# Patient Record
Sex: Female | Born: 1935 | Race: White | Hispanic: No | Smoking: Never smoker
Health system: Southern US, Community
[De-identification: ages and names within clinical notes are randomized; demographics above are authoritative.]

## PROBLEM LIST (undated history)

## (undated) DIAGNOSIS — F419 Anxiety disorder, unspecified: Secondary | ICD-10-CM

## (undated) DIAGNOSIS — E7800 Pure hypercholesterolemia, unspecified: Secondary | ICD-10-CM

## (undated) DIAGNOSIS — M810 Age-related osteoporosis without current pathological fracture: Secondary | ICD-10-CM

## (undated) DIAGNOSIS — R42 Dizziness and giddiness: Secondary | ICD-10-CM

## (undated) HISTORY — PX: KNEE ARTHROSCOPY: SUR90

## (undated) HISTORY — PX: RECTAL SURGERY: SHX760

---

## 2004-08-27 ENCOUNTER — Ambulatory Visit: Payer: Self-pay | Admitting: Internal Medicine

## 2005-05-21 ENCOUNTER — Ambulatory Visit: Payer: Self-pay | Admitting: Surgery

## 2005-06-01 ENCOUNTER — Ambulatory Visit: Payer: Self-pay | Admitting: Surgery

## 2005-06-01 ENCOUNTER — Other Ambulatory Visit: Payer: Self-pay

## 2005-06-10 ENCOUNTER — Inpatient Hospital Stay: Payer: Self-pay | Admitting: Surgery

## 2005-08-31 ENCOUNTER — Ambulatory Visit: Payer: Self-pay | Admitting: Internal Medicine

## 2006-09-02 ENCOUNTER — Ambulatory Visit: Payer: Self-pay | Admitting: Internal Medicine

## 2007-07-06 ENCOUNTER — Ambulatory Visit: Payer: Self-pay | Admitting: Surgery

## 2007-07-07 ENCOUNTER — Ambulatory Visit: Payer: Self-pay | Admitting: Surgery

## 2007-09-05 ENCOUNTER — Ambulatory Visit: Payer: Self-pay | Admitting: Internal Medicine

## 2007-09-19 ENCOUNTER — Inpatient Hospital Stay: Payer: Self-pay | Admitting: Internal Medicine

## 2008-08-12 IMAGING — RF DG BARIUM SWALLOW
1 series · 8 of 8 positions shown · non-contrast
Comparison: none

REASON FOR EXAM: dysphagia
COMMENTS:

PROCEDURE:     FL  - FL BARIUM SWALLOW  - September 26, 2007  [DATE]
RESULT:     A hiatal hernia is noted with reflux.  Diffuse tertiary
esophageal contractions are present.  This is most likely secondary to
presbyesophagus.  The patient had no difficulty with swallowing a standard
barium tablet.

[Series 1: run · 5 acquisitions, 8 frames shown]
[im 1/5]
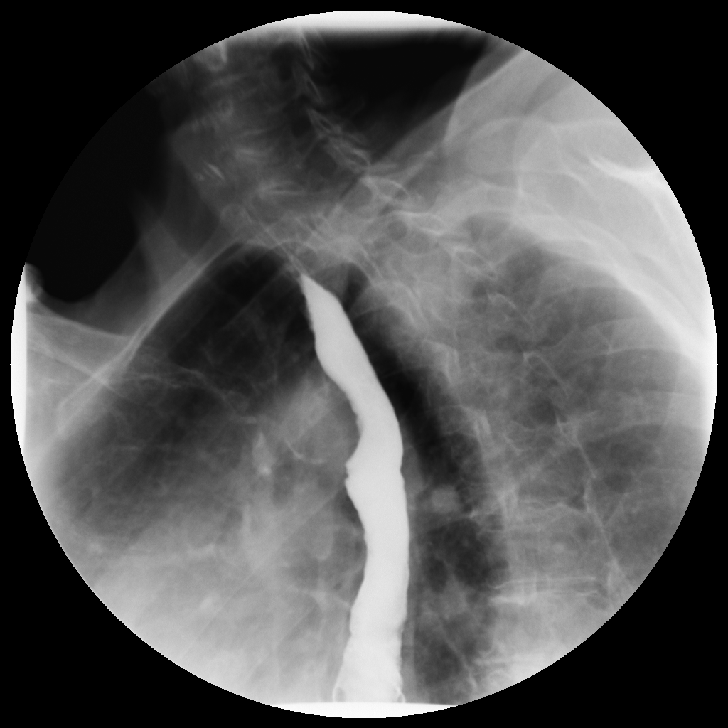
[im 1/5]
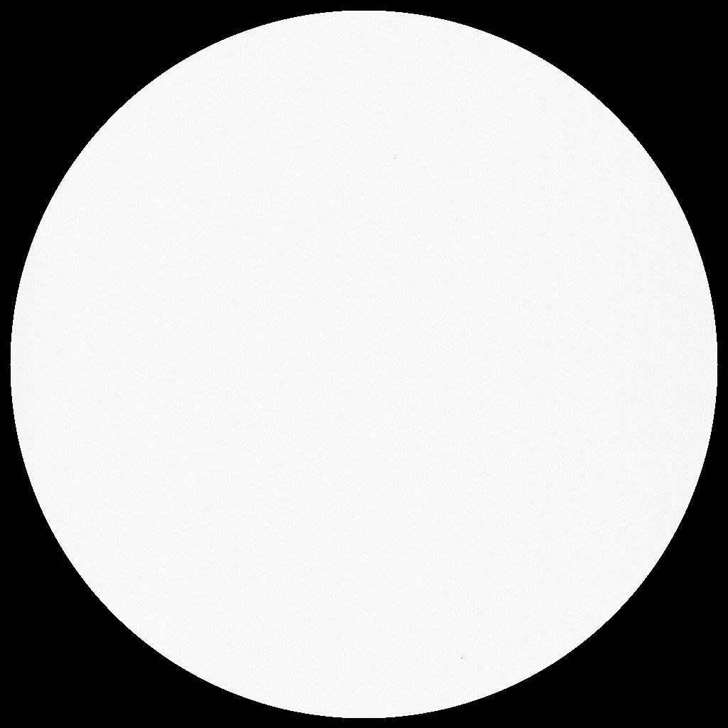
[im 2/5]
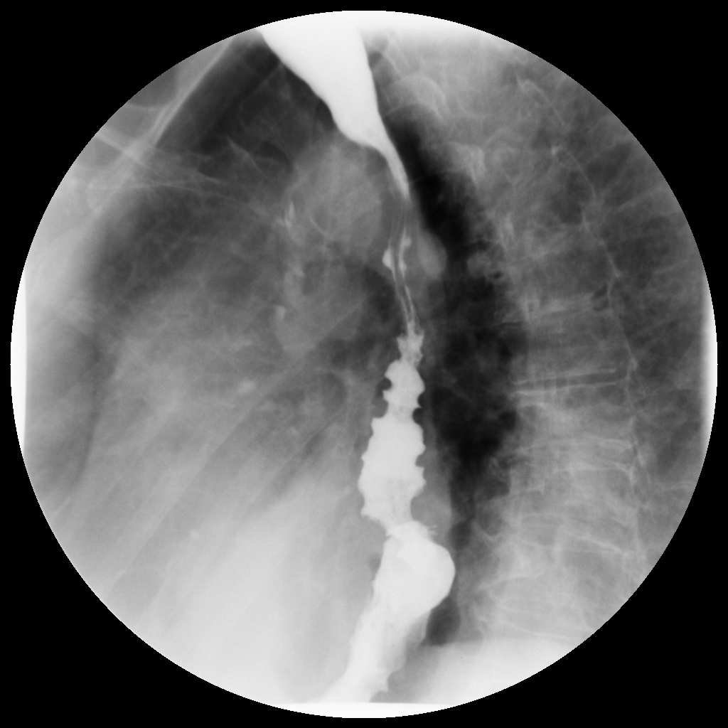
[im 2/5]
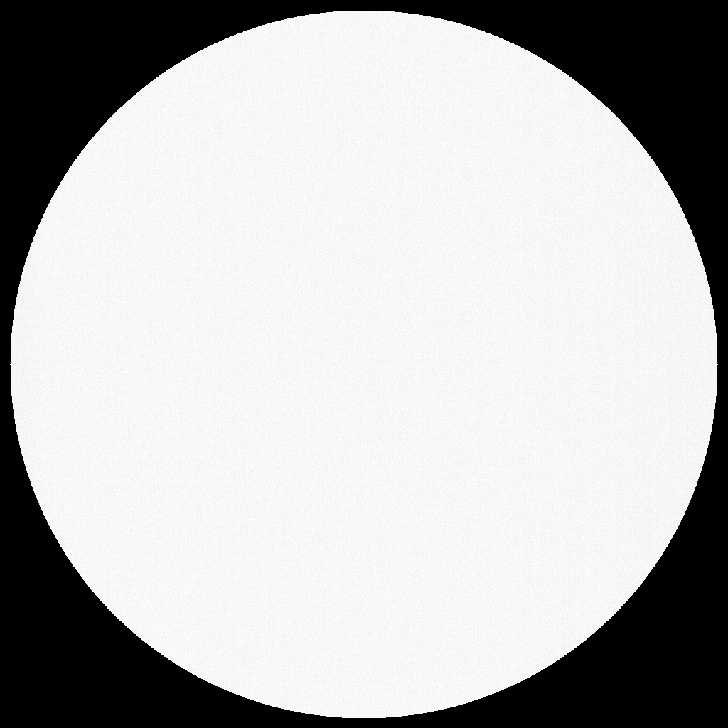
[im 3/5]
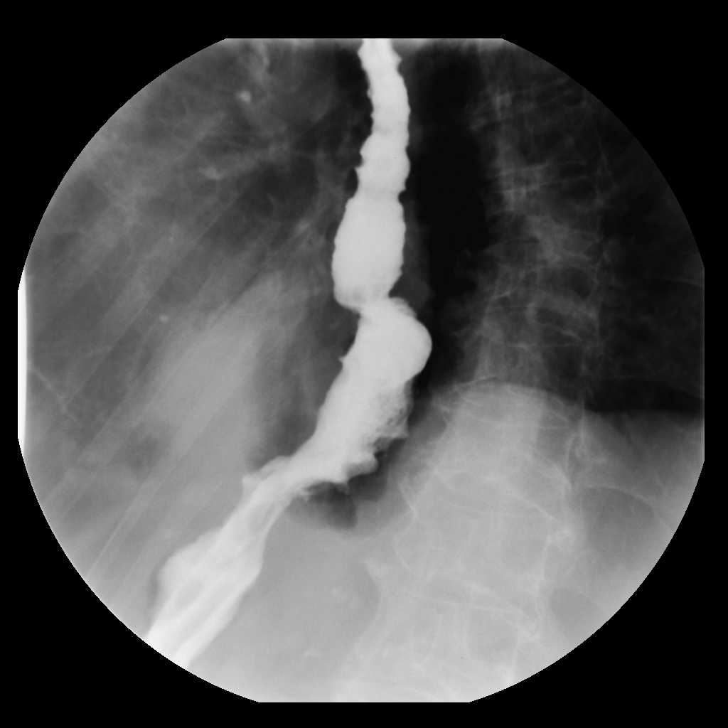
[im 3/5]
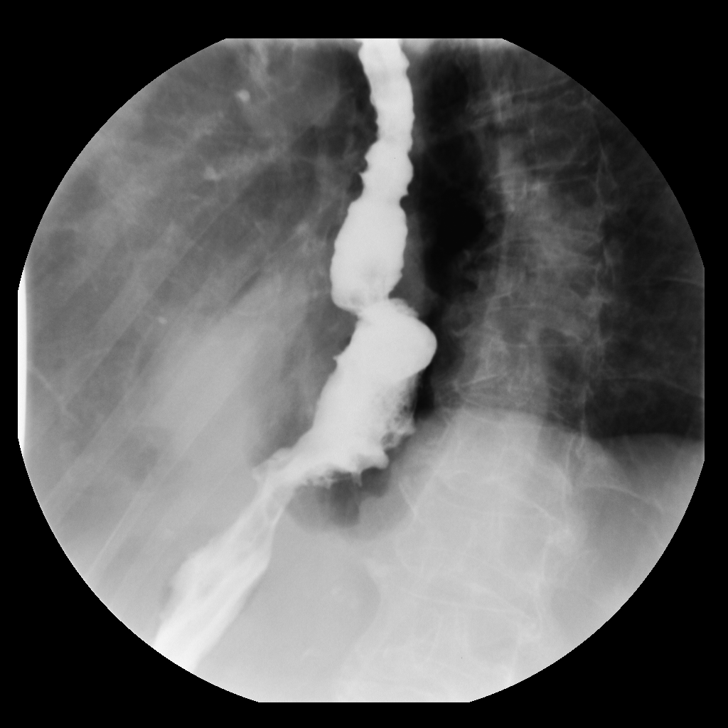
[im 4/5]
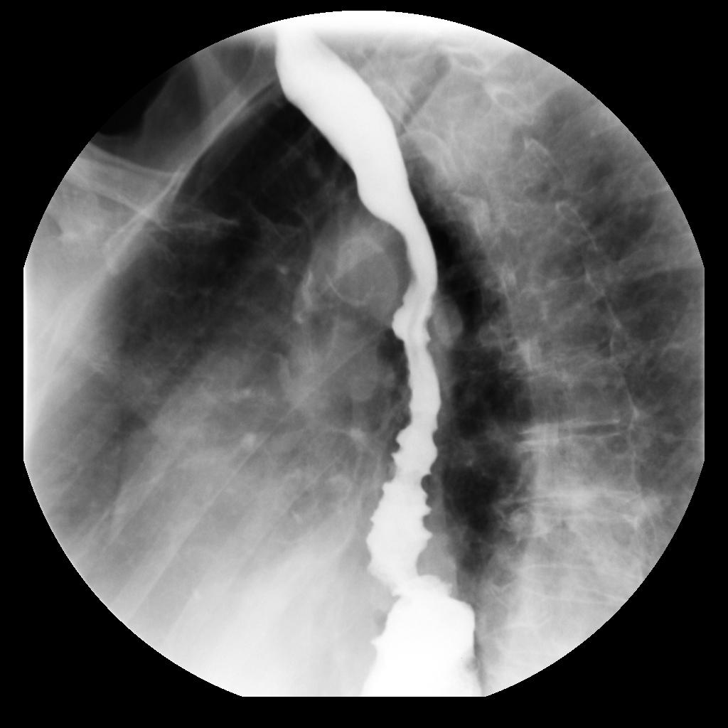
[im 5/5]
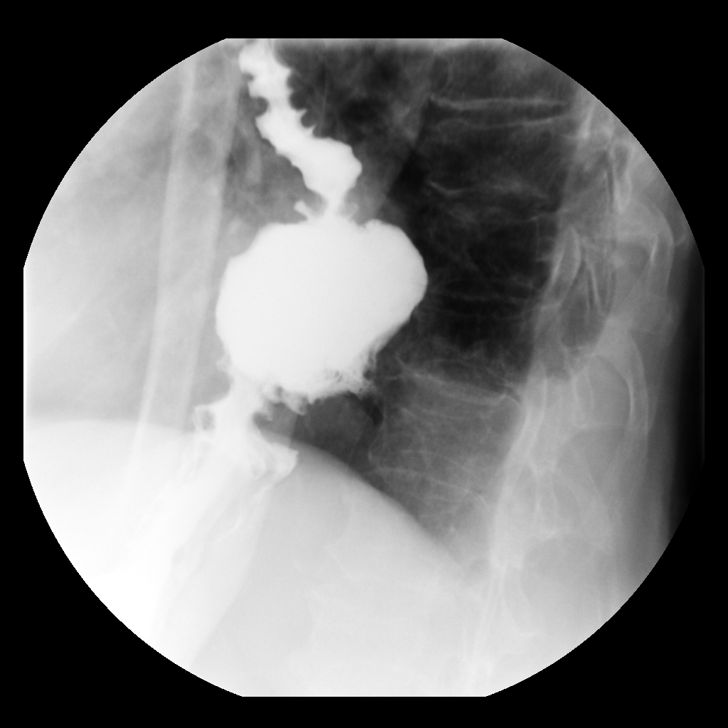

[8 of 8 positions shown; findings below may reference images not displayed]

IMPRESSION: Prominent hiatal hernia with gastroesophageal reflux.  This is a slightly
limited study due to the patient's age.  The patient had no problem
swallowing a standard barium tablet.

## 2009-10-25 ENCOUNTER — Ambulatory Visit: Payer: Self-pay | Admitting: Orthopedic Surgery

## 2009-11-01 ENCOUNTER — Ambulatory Visit: Payer: Self-pay | Admitting: Orthopedic Surgery

## 2009-11-25 ENCOUNTER — Ambulatory Visit: Payer: Self-pay | Admitting: Orthopedic Surgery

## 2009-11-29 ENCOUNTER — Ambulatory Visit: Payer: Self-pay | Admitting: Orthopedic Surgery

## 2011-03-11 ENCOUNTER — Ambulatory Visit: Payer: Self-pay | Admitting: Orthopedic Surgery

## 2011-03-17 ENCOUNTER — Ambulatory Visit: Payer: Self-pay | Admitting: Orthopedic Surgery

## 2012-08-17 ENCOUNTER — Ambulatory Visit: Payer: Self-pay | Admitting: Surgery

## 2012-08-18 LAB — PATHOLOGY REPORT

## 2014-11-23 NOTE — Op Note (Signed)
PATIENT NAME:  Margaret Stephens, Margaret Stephens MR#:  [id number] DATE OF BIRTH:  [birth year]  DATE OF PROCEDURE:  08/17/2012  PREOPERATIVE DIAGNOSIS: Past history of villous adenoma of the rectum.   POSTOPERATIVE DIAGNOSES:  1. Rectal polyps. 2. Diverticulosis.  3. Internal and external hemorrhoids.   PROCEDURES: Colonoscopy, biopsy and cautery.   SURGEON: Renda RollsWilton Solene Hereford, MD   ANESTHESIA: Monitored anesthesia care with intravenous sedation.   INDICATION: This 81 year old female has a history of a transanal resection of a villous adenoma and subsequent transanal resection of a recurrent villous adenoma, has been monitored for a number of years and is now brought back for a follow-up colonoscopy.  DESCRIPTION OF PROCEDURE:  The patient was placed on the stretcher in the left lateral decubitus position and was monitored with pulse oximetry, intermittent blood pressure recordings, and cardiac monitor, given oxygen via nasal cannula at a rate of 2 L/min, monitored by the anesthesia staff and sedated.   The Olympus Video Colonoscope was inserted through the rectum and passed up into the colon and was manipulated around. It appeared that the scope actually advanced in as far as a meter, but there was some tortuosity of the colon. The scope was well into the transverse colon possibly as far as the ascending colon; however, there was significant tortuosity and it did not appear that the scope could be safely passed any farther. Colon preparation appeared to be good. A small amount of bilious feculent material was aspirated. The scope was gradually pulled back, viewing the colonic mucosa, seeing no polyps or tumors in this portion of the bowel. There were a few sigmoid diverticula noted. There were two small polyps in the rectum which just appeared to be few millimeters in dimension. Each of these were removed with hot biopsy and completely obliterated and cauterized at the base and submitted in one container for routine  pathology. It appeared that they may possibly be hyperplastic. The scope was retroflexed, and I could identify the site where she had had some scarring and history of excision of villous adenoma. There were some small internal hemorrhoids appreciated. The scope was subsequently removed. Digital anorectal exam demonstrated no palpable mass. She did have some external hemorrhoids.   The patient was subsequently prepared for transfer to the recovery room.   ____________________________ Shela CommonsJ. Renda RollsWilton Wallice Granville, MD jws:cb D: 08/17/2012 09:15:15 ET T: 08/17/2012 10:54:03 ET JOB#: [id number]  cc: Adella HareJ. Wilton Tongela Encinas, MD, <Dictator> Adella HareWILTON J Brentney Goldbach MD ELECTRONICALLY SIGNED 08/19/2012 16:56

## 2016-05-14 ENCOUNTER — Emergency Department: Payer: Medicare Other

## 2016-05-14 ENCOUNTER — Encounter: Payer: Self-pay | Admitting: Emergency Medicine

## 2016-05-14 ENCOUNTER — Emergency Department
Admission: EM | Admit: 2016-05-14 | Discharge: 2016-05-14 | Disposition: A | Discharge status: Home / Self Care | Payer: Medicare Other | Attending: Emergency Medicine | Admitting: Emergency Medicine

## 2016-05-14 DIAGNOSIS — R509 Fever, unspecified: Secondary | ICD-10-CM | POA: Diagnosis present

## 2016-05-14 DIAGNOSIS — J069 Acute upper respiratory infection, unspecified: Secondary | ICD-10-CM | POA: Insufficient documentation

## 2016-05-14 DIAGNOSIS — J111 Influenza due to unidentified influenza virus with other respiratory manifestations: Secondary | ICD-10-CM

## 2016-05-14 DIAGNOSIS — R69 Illness, unspecified: Secondary | ICD-10-CM

## 2016-05-14 LAB — COMPREHENSIVE METABOLIC PANEL
ALK PHOS: 64 U/L (ref 38–126)
ALT: 15 U/L (ref 14–54)
AST: 25 U/L (ref 15–41)
Albumin: 4.2 g/dL (ref 3.5–5.0)
Anion gap: 9 (ref 5–15)
BUN: 9 mg/dL (ref 6–20)
CALCIUM: 9.4 mg/dL (ref 8.9–10.3)
CHLORIDE: 99 mmol/L — AB (ref 101–111)
CO2: 28 mmol/L (ref 22–32)
CREATININE: 0.66 mg/dL (ref 0.44–1.00)
Glucose, Bld: 106 mg/dL — ABNORMAL HIGH (ref 65–99)
Potassium: 4.1 mmol/L (ref 3.5–5.1)
Sodium: 136 mmol/L (ref 135–145)
Total Bilirubin: 0.8 mg/dL (ref 0.3–1.2)
Total Protein: 8.1 g/dL (ref 6.5–8.1)

## 2016-05-14 LAB — CBC WITH DIFFERENTIAL/PLATELET
BASOS ABS: 0 10*3/uL (ref 0–0.1)
BASOS PCT: 0 %
EOS PCT: 0 %
Eosinophils Absolute: 0 10*3/uL (ref 0–0.7)
HCT: 41 % (ref 35.0–47.0)
Hemoglobin: 14.1 g/dL (ref 12.0–16.0)
Lymphocytes Relative: 12 %
Lymphs Abs: 0.9 10*3/uL — ABNORMAL LOW (ref 1.0–3.6)
MCH: 30.8 pg (ref 26.0–34.0)
MCHC: 34.4 g/dL (ref 32.0–36.0)
MCV: 89.7 fL (ref 80.0–100.0)
Monocytes Absolute: 0.7 10*3/uL (ref 0.2–0.9)
Monocytes Relative: 10 %
NEUTROS ABS: 5.9 10*3/uL (ref 1.4–6.5)
Neutrophils Relative %: 78 %
PLATELETS: 175 10*3/uL (ref 150–440)
RBC: 4.57 MIL/uL (ref 3.80–5.20)
RDW: 13.8 % (ref 11.5–14.5)
WBC: 7.5 10*3/uL (ref 3.6–11.0)

## 2016-05-14 LAB — URINALYSIS COMPLETE WITH MICROSCOPIC (ARMC ONLY)
Bilirubin Urine: NEGATIVE
GLUCOSE, UA: NEGATIVE mg/dL
Hgb urine dipstick: NEGATIVE
Ketones, ur: NEGATIVE mg/dL
Nitrite: NEGATIVE
Protein, ur: NEGATIVE mg/dL
SQUAMOUS EPITHELIAL / LPF: NONE SEEN
Specific Gravity, Urine: 1.006 (ref 1.005–1.030)
pH: 7 (ref 5.0–8.0)

## 2016-05-14 LAB — LACTIC ACID, PLASMA: LACTIC ACID, VENOUS: 1 mmol/L (ref 0.5–1.9)

## 2016-05-14 LAB — INFLUENZA PANEL BY PCR (TYPE A & B)
H1N1 flu by pcr: NOT DETECTED
INFLAPCR: NEGATIVE
Influenza B By PCR: NEGATIVE

## 2016-05-14 MED ORDER — ACETAMINOPHEN 325 MG PO TABS
ORAL_TABLET | ORAL | Status: AC
  Filled 2016-05-14: qty 2

## 2016-05-14 MED ORDER — LEVOFLOXACIN 500 MG PO TABS: 500.0000 mg | ORAL_TABLET | Freq: Every day | ORAL | 0 refills | Status: AC

## 2016-05-14 MED ORDER — ACETAMINOPHEN 325 MG PO TABS
650.0000 mg | ORAL_TABLET | Freq: Once | ORAL | Status: AC
  Administered 2016-05-14: 650 mg via ORAL

## 2016-05-14 MED ORDER — SODIUM CHLORIDE 0.9 % IV BOLUS (SEPSIS)
1000.0000 mL | Freq: Once | INTRAVENOUS | Status: AC
  Administered 2016-05-14: 1000 mL via INTRAVENOUS

## 2016-05-14 MED ORDER — LEVOFLOXACIN 500 MG PO TABS
500.0000 mg | ORAL_TABLET | Freq: Once | ORAL | Status: AC
  Administered 2016-05-14: 500 mg via ORAL
  Filled 2016-05-14: qty 1

## 2016-05-14 NOTE — ED Provider Notes (Addendum)
[facility] Emergency Department Provider Note  ____________________________________________   I have reviewed the triage vital signs and the nursing notes.   HISTORY  Chief Complaint Fever and Sore Throat    HPI Margaret Stephens is a 82 y.o. female who presents today with cough runny nose sore throat and fever for the last 2 days. He is not had any vomiting or nausea or diarrhea or abdominal pain she denies dysuria. She has been drinking and eating less. Her sore throat is improving. She has had a occasionally productive of mostly dry cough. She states she feels "pretty good". She was sent here for possible dehydration and evaluation with chest x-ray. Fever started today.     History reviewed. No pertinent past medical history.  There are no active problems to display for this patient.   History reviewed. No pertinent surgical history.  Prior to Admission medications   Not on File    Allergies Review of patient's allergies indicates no known allergies.  No family history on file.  Social History Social History  Substance Use Topics  . Smoking status: Never Smoker  . Smokeless tobacco: Never Used  . Alcohol use No    Review of Systems Constitutional: Positive fever/chills Eyes: No visual changes. ENT: Positive sore throat. No stiff neck no neck pain Cardiovascular: Denies chest pain. Respiratory: Denies shortness of breath. All that of cough Gastrointestinal:   no vomiting.  No diarrhea.  No constipation. Genitourinary: Negative for dysuria. Musculoskeletal: Negative lower extremity swelling Skin: Negative for rash. Neurological: Negative for severe headaches, focal weakness or numbness. 10-point ROS otherwise negative.  ____________________________________________   PHYSICAL EXAM:  VITAL SIGNS: ED Triage Vitals  Enc Vitals Group     BP 05/14/16 1537 (!) 161/80     Pulse Rate 05/14/16 1537 (!) 106     Resp 05/14/16 1537 18   Temp 05/14/16 1537 (!) 101.3 F (38.5 C)     Temp Source 05/14/16 1537 Oral     SpO2 05/14/16 1537 93 %     Weight 05/14/16 1535 122 lb (55.3 kg)     Height 05/14/16 1535 4\' 11"  (1.499 m)     Head Circumference --      Peak Flow --      Pain Score 05/14/16 1535 3     Pain Loc --      Pain Edu? --      Excl. in GC? --     Constitutional: Alert and oriented. Well appearing and in no acute distress. Eyes: Conjunctivae are normal. PERRL. EOMI. Head: Atraumatic. Nose: Positive mild clear congestion/rhinnorhea. Mouth/Throat: Mucous membranes are moist.  Oropharynx non-erythematous.Mild cobblestoning noted Neck: No stridor.   Nontender with no meningismus Cardiovascular: Normal rate, regular rhythm. Grossly normal heart sounds.  Good peripheral circulation. Respiratory: Normal respiratory effort.  No retractions. Lungs CTAB. Abdominal: Soft and nontender. No distention. No guarding no rebound Back:  There is no focal tenderness or step off.  there is no midline tenderness there are no lesions noted. there is no CVA tenderness Musculoskeletal: No lower extremity tenderness, no upper extremity tenderness. No joint effusions, no DVT signs strong distal pulses no edema Neurologic:  Normal speech and language. No gross focal neurologic deficits are appreciated.  Skin:  Skin is warm, dry and intact. No rash noted. Psychiatric: Mood and affect are normal. Speech and behavior are normal.  ____________________________________________   LABS (all labs ordered are listed, but only abnormal results are displayed)  Labs Reviewed  CBC WITH DIFFERENTIAL/PLATELET - Abnormal; Notable for the following:       Result Value   Lymphs Abs 0.9 (*)    All other components within normal limits  COMPREHENSIVE METABOLIC PANEL - Abnormal; Notable for the following:    Chloride 99 (*)    Glucose, Bld 106 (*)    All other components within normal limits  CULTURE, BLOOD (ROUTINE X 2)  CULTURE, BLOOD (ROUTINE X  2)  URINE CULTURE  LACTIC ACID, PLASMA  INFLUENZA PANEL BY PCR (TYPE A & B, H1N1)  LACTIC ACID, PLASMA  URINALYSIS COMPLETEWITH MICROSCOPIC ([facility] ONLY)   ____________________________________________  EKG  I personally interpreted any EKGs ordered by me or triage  ____________________________________________  RADIOLOGY  I reviewed any imaging ordered by me or triage that were performed during my shift and, if possible, patient and/or family made aware of any abnormal findings. ____________________________________________   PROCEDURES  Procedure(s) performed: None  Procedures  Critical Care performed: None  ____________________________________________   INITIAL IMPRESSION / ASSESSMENT AND PLAN / ED COURSE  Pertinent labs & imaging results that were available during my care of the patient were reviewed by me and considered in my medical decision making (see chart for details).  Patient with URI symptoms and a fever. Concern for pneumonia exists but the patient's chest x-ray is reassuring. Occasionally we have poor waveform and corresponding poor sats but when she takes deep breaths and we are getting a good reading she is in the mid to high 90s. We're giving her IV fluid. Her creatinine and BUN are reassuring however and I am not seeing much in the way of significant dehydration. Patient flu test is pending. We will also check a urine because of her fever. Her lactic is negative her white count is reassuring. There is no evidence of sepsis. Vital signs are reassuring. We will further evaluate her with a flu test. Patient feeling better with IV fluid. Abdomen is benign. No obvious source of bacterial infection noted. Certainly no evidence of strep throat or TOA RPA etc.  ----------------------------------------- 8:21 PM on 05/14/2016 -----------------------------------------  She continues very well-appearing, did discuss with the family the possibility this is likely a viral  illness. They're adamant however that she should have antibiotics. They feel that Levaquin is what she usually gets when she has this and it usually makes her better. Patient has had significant pneumonia problems in the past. Caution saturations 100% in the room. I did explain to them the risk of antibiotic and clearly significant diarrhea or other pathologies and they are understanding. However, they would very much feel that the patient would do better with Levaquin. Given that the patient has a history of pneumonia, is 82 years old has a fever and a cough we will treat her empirically as it is certainly possible that she has a pneumonia that is not yet appearing on chest x-ray. Return precautions and follow-up given and understood. We also did discuss the risks of fluoroquinolones and patient's her age but again family feels adamant that this is helped in the past and patient as needed and indicated has certainly some significant risk factors of this deteriorating into a more significant pathology Clinical Course   ____________________________________________   FINAL CLINICAL IMPRESSION(S) / ED DIAGNOSES  Final diagnoses:  None      This chart was dictated using voice recognition software.  Despite best efforts to proofread,  errors can occur which can change meaning.      Jeanmarie PlantJames A Marshell Dilauro,  MD 05/14/16 1936    Jeanmarie Plant, MD 05/14/16 1610    Jeanmarie Plant, MD 05/14/16 2022

## 2016-05-14 NOTE — ED Notes (Signed)
Pt c/o sore throat and fever x2-3 days. Pt denies n/v/d. Pt states decreased appetite and PO intake. Pt A&O, in NAD at this time

## 2016-05-14 NOTE — ED Triage Notes (Addendum)
C/O fever and sore throat and cough x 2 days.  Seen by Dr. Dan HumphreysWalker today and sent to ED for evaluation due to being dehydrated and possible pneumonia.

## 2016-05-16 LAB — URINE CULTURE

## 2016-05-19 LAB — CULTURE, BLOOD (ROUTINE X 2): CULTURE: NO GROWTH

## 2016-12-19 ENCOUNTER — Encounter: Payer: Self-pay | Admitting: Emergency Medicine

## 2016-12-19 ENCOUNTER — Encounter: Payer: Self-pay | Admitting: *Deleted

## 2016-12-19 ENCOUNTER — Emergency Department: Payer: Medicare Other

## 2016-12-19 ENCOUNTER — Ambulatory Visit
Admission: EM | Admit: 2016-12-19 | Discharge: 2016-12-19 | Disposition: A | Discharge status: Hospice | Payer: Medicare Other | Source: Home / Self Care

## 2016-12-19 ENCOUNTER — Emergency Department
Admission: EM | Admit: 2016-12-19 | Discharge: 2016-12-19 | Disposition: A | Discharge status: Home / Self Care | Payer: Medicare Other | Attending: Emergency Medicine | Admitting: Emergency Medicine

## 2016-12-19 DIAGNOSIS — Z23 Encounter for immunization: Secondary | ICD-10-CM | POA: Diagnosis not present

## 2016-12-19 DIAGNOSIS — Z79899 Other long term (current) drug therapy: Secondary | ICD-10-CM | POA: Insufficient documentation

## 2016-12-19 DIAGNOSIS — L03011 Cellulitis of right finger: Secondary | ICD-10-CM | POA: Insufficient documentation

## 2016-12-19 DIAGNOSIS — M79644 Pain in right finger(s): Secondary | ICD-10-CM | POA: Diagnosis present

## 2016-12-19 MED ORDER — BUPIVACAINE HCL (PF) 0.5 % IJ SOLN
10.0000 mL | Freq: Once | INTRAMUSCULAR | Status: DC
  Filled 2016-12-19: qty 30

## 2016-12-19 MED ORDER — LIDOCAINE HCL (PF) 1 % IJ SOLN
5.0000 mL | Freq: Once | INTRAMUSCULAR | Status: DC
  Filled 2016-12-19: qty 5

## 2016-12-19 MED ORDER — CEFAZOLIN SODIUM 1 G IJ SOLR
1.0000 g | Freq: Once | INTRAMUSCULAR | Status: AC
  Administered 2016-12-19: 1 g via INTRAMUSCULAR
  Filled 2016-12-19: qty 10

## 2016-12-19 MED ORDER — TRAMADOL HCL 50 MG PO TABS: 50.0000 mg | ORAL_TABLET | Freq: Four times a day (QID) | ORAL | 0 refills | Status: DC | PRN

## 2016-12-19 MED ORDER — TETANUS-DIPHTH-ACELL PERTUSSIS 5-2.5-18.5 LF-MCG/0.5 IM SUSP
0.5000 mL | Freq: Once | INTRAMUSCULAR | Status: AC
  Administered 2016-12-19: 0.5 mL via INTRAMUSCULAR
  Filled 2016-12-19: qty 0.5

## 2016-12-19 MED ORDER — CEPHALEXIN 500 MG PO CAPS: 500.0000 mg | ORAL_CAPSULE | Freq: Four times a day (QID) | ORAL | 0 refills | Status: AC

## 2016-12-19 NOTE — ED Triage Notes (Signed)
Pt sent from urgent care for felon of right thumb.  Swelling/redness noted. Sx started Thursday. Pt ambulatory to triage. No medical hx. NAD

## 2016-12-19 NOTE — ED Provider Notes (Signed)
CSN: [id number]     Arrival date & time 12/19/16  0907 History   First MD Initiated Contact with Patient 12/19/16 0919     Chief Complaint  Patient presents with  . Hand Pain   (Consider location/radiation/quality/duration/timing/severity/associated sxs/prior Treatment) HPI This 83 year old female who presents with a right dominant swollen thumb tip. Is it is been bothering her for the last 2 days and is worsening. Earlier in the week she had cut and trimmed her nails does not remember any specific injury to nail but shortly afterwards developed of the swelling of the thumb. Now any motion makes the pain worse. She has no fever or chills.      History reviewed. No pertinent past medical history. Past Surgical History:  Procedure Laterality Date  . KNEE ARTHROSCOPY Left   . RECTAL SURGERY     History reviewed. No pertinent family history. Social History  Substance Use Topics  . Smoking status: Never Smoker  . Smokeless tobacco: Never Used  . Alcohol use No   OB History    No data available     Review of Systems  Constitutional: Positive for activity change. Negative for chills, fatigue and fever.  Musculoskeletal: Positive for arthralgias.  All other systems reviewed and are negative.   Allergies  Patient has no known allergies.  Home Medications   Prior to Admission medications   Medication Sig Start Date End Date Taking? Authorizing Provider  alendronate (FOSAMAX) 70 MG tablet Take 70 mg by mouth once a week. Take with a full glass of water on an empty stomach.   Yes [provider]  aspirin EC 81 MG tablet Take 81 mg by mouth daily.   Yes [provider]  LORazepam (ATIVAN) 0.5 MG tablet Take 0.5 mg by mouth every 8 (eight) hours.   Yes [provider]  simvastatin (ZOCOR) 40 MG tablet Take 40 mg by mouth daily.   Yes [provider]   Meds Ordered and Administered this Visit  Medications - No data to display  BP (!) 163/75  (BP Location: Left Arm)   Pulse 83   Temp 98.4 F (36.9 C) (Oral)   Resp 16   Ht 4\' 11"  (1.499 m)   Wt 119 lb (54 kg)   SpO2 96%   BMI 24.04 kg/m  No data found.   Physical Exam  Constitutional: She is oriented to person, place, and time. She appears well-developed and well-nourished. No distress.  HENT:  Head: Normocephalic and atraumatic.  Eyes: Pupils are equal, round, and reactive to light.  Neck: Normal range of motion.  Musculoskeletal: She exhibits edema and tenderness.  Examination of the right dominant thumb shows enlargement I erythema and fluctuance of the distal thumb pad. Is no drainage present. There is no evidence of a paronychia. Next pain and redness and swelling are extending into the first web space.  Neurological: She is alert and oriented to person, place, and time.  Skin: Skin is warm and dry. She is not diaphoretic.  Psychiatric: She has a normal mood and affect. Her behavior is normal. Judgment and thought content normal.  Nursing note and vitals reviewed.   Urgent Care Course     Procedures (including critical care time)  Labs Review Labs Reviewed - No data to display  Imaging Review No results found.   Visual Acuity Review  Right Eye Distance:   Left Eye Distance:   Bilateral Distance:    Right Eye Near:   Left Eye Near:  Bilateral Near:         MDM   1. Felon of finger of right hand    I have had a discussion with the patient and her family member accompanying her. I told them that this will need surgical drainage that we do not perform in our office. I recommended emergency room. [facility] I contacted the triage nurse to alert her of the patient's arrival and condition. She left in stable condition and will be transported in a privately owned vehicle.    Lutricia FeilRoemer, Walida Cajas P, PA-C 12/19/16 [phone]

## 2016-12-19 NOTE — Discharge Instructions (Signed)
Call and schedule an appointment with Dr. Stephenie AcresSoria. She will be in her office on Tuesday. Call Monday to schedule an appointment.  Keep the bandage in place for 24 hours unless it gets saturated, wet, or dirty. Change the bandage after 24 hours.  If the pain or swelling gets worse before you can see the specialist, return to the ER.

## 2016-12-19 NOTE — ED Provider Notes (Signed)
[facility] Emergency Department Provider Note  ____________________________________________  Time seen: Approximately 11:14 AM  I have reviewed the triage vital signs and the nursing notes.   HISTORY  Chief Complaint felon right thumb   HPI Margaret Stephens is a 83 y.o. female who presents to the emergency department for treatment of infection to the right thumb. Symptoms started 2 days ago. She states that she had trimmed her nails earlier in the week, but denies any specific injury. No fever. Tetanus is not up-to-date.  History reviewed. No pertinent past medical history.  There are no active problems to display for this patient.   Past Surgical History:  Procedure Laterality Date  . KNEE ARTHROSCOPY Left   . RECTAL SURGERY      Prior to Admission medications   Medication Sig Start Date End Date Taking? Authorizing Provider  alendronate (FOSAMAX) 70 MG tablet Take 70 mg by mouth once a week. Take with a full glass of water on an empty stomach.    [provider]  aspirin EC 81 MG tablet Take 81 mg by mouth daily.    [provider]  cephALEXin (KEFLEX) 500 MG capsule Take 1 capsule (500 mg total) by mouth 4 (four) times daily. 12/19/16 12/29/16  Jaquell Seddon, Rulon Eisenmenger B, FNP  LORazepam (ATIVAN) 0.5 MG tablet Take 0.5 mg by mouth every 8 (eight) hours.    [provider]  simvastatin (ZOCOR) 40 MG tablet Take 40 mg by mouth daily.    [provider]  traMADol (ULTRAM) 50 MG tablet Take 1 tablet (50 mg total) by mouth every 6 (six) hours as needed. 12/19/16   Chinita Pester, FNP    Allergies Patient has no known allergies.  History reviewed. No pertinent family history.  Social History Social History  Substance Use Topics  . Smoking status: Never Smoker  . Smokeless tobacco: Never Used  . Alcohol use No    Review of Systems  Constitutional: Negative for fever. Negative for injury.  Respiratory: Negative for cough or  shortness of breath.  Musculoskeletal: Positive for decreased range of motion of the right thumb  Skin: Positive for tenderness and swelling to the right thumb Neurological: Negative for loss of motor or sensation in the right hand ____________________________________________   PHYSICAL EXAM:  VITAL SIGNS: ED Triage Vitals  Enc Vitals Group     BP 12/19/16 1007 (!) 181/100     Pulse Rate 12/19/16 1007 86     Resp 12/19/16 1007 18     Temp 12/19/16 1007 98 F (36.7 C)     Temp Source 12/19/16 1007 Oral     SpO2 12/19/16 1007 95 %     Weight 12/19/16 1006 119 lb (54 kg)     Height 12/19/16 1006 4\' 11"  (1.499 m)     Head Circumference --      Peak Flow --      Pain Score 12/19/16 1006 4     Pain Loc --      Pain Edu? --      Excl. in GC? --      Constitutional: Well appearing. No acute distress. Mouth/Throat: Mucous membranes are moist. No dysphasia. Neck: Full range of motion. No stridor. Lymphatic: No palpable lymphadenopathy over the right upper extremity  Cardiovascular: Right radial pulse 2+ Respiratory: Even and unlabored. Musculoskeletal: Limited flexion at the DIP of the right thumb secondary to pain and swelling Neurologic: Motor and sensation is intact of the right thumb Skin:  Pad of the right thumb is fluctuant and swollen  ____________________________________________   LABS (all labs ordered are listed, but only abnormal results are displayed)  Labs Reviewed - No data to display ____________________________________________  EKG   ____________________________________________  RADIOLOGY  No acute osseous abnormality of the right thumb per radiology. ____________________________________________   PROCEDURES  Procedure(s) performed:  INCISION AND DRAINAGE Performed by: Kem Boroughsari Cherl Gorney Consent: Verbal consent obtained. Risks and benefits: risks, benefits and alternatives were discussed Type: abscess  Body area: Pad of right thumb  Anesthesia:  local infiltration  Incision was made on the radial side of the thumb with a #11 blade.  Local anesthetic: lidocaine 1% without epinephrine; 0.5 bupivacaine  Anesthetic total: 3 ml  Complexity: complex  Blunt dissection to break up loculations  Drainage: purulent  Drainage amount: Small  Patient tolerance: Patient tolerated the procedure well with no immediate complications. ___________________________________________   INITIAL IMPRESSION / ASSESSMENT AND PLAN / ED COURSE  Margaret Stephens is a 83 y.o. female who presents to the emergency department for evaluation of thumb infection.Felon I&D completed today in the ER with result of small amount of purulent drainage. IM Ancef was given as well as tetanus update. She will be prescribed Keflex 4 times per day and tramadol. Family will contact the hand specialist on Monday to schedule a follow-up appointment for Tuesday. Wound care and ER return precautions were discussed with the patient and family.   Pertinent labs & imaging results that were available during my care of the patient were reviewed by me and considered in my medical decision making (see chart for details). ____________________________________________   FINAL CLINICAL IMPRESSION(S) / ED DIAGNOSES  Final diagnoses:  Felon of finger of right hand    New Prescriptions   CEPHALEXIN (KEFLEX) 500 MG CAPSULE    Take 1 capsule (500 mg total) by mouth 4 (four) times daily.   TRAMADOL (ULTRAM) 50 MG TABLET    Take 1 tablet (50 mg total) by mouth every 6 (six) hours as needed.    If controlled substance prescribed during this visit, 12 month history viewed on the [facility] prior to issuing an initial prescription for Schedule II or III opiod.   Note:  This document was prepared using Dragon voice recognition software and may include unintentional dictation errors.    Chinita Pesterriplett, [place] Sylvan B, FNP 12/19/16 1202    Arnaldo NatalMalinda, Paul F, MD 12/19/16 [phone]

## 2016-12-19 NOTE — ED Triage Notes (Signed)
Pt c/o right thumb pain and edema. Denies injury.

## 2017-05-21 ENCOUNTER — Encounter: Payer: Self-pay | Admitting: Emergency Medicine

## 2017-05-21 ENCOUNTER — Emergency Department: Payer: Medicare Other

## 2017-05-21 ENCOUNTER — Observation Stay
Admission: EM | Admit: 2017-05-21 | Discharge: 2017-05-23 | Disposition: A | Discharge status: Home / Self Care | Payer: Medicare Other | Attending: Internal Medicine | Admitting: Internal Medicine

## 2017-05-21 ENCOUNTER — Observation Stay: Payer: Medicare Other

## 2017-05-21 DIAGNOSIS — K449 Diaphragmatic hernia without obstruction or gangrene: Secondary | ICD-10-CM | POA: Insufficient documentation

## 2017-05-21 DIAGNOSIS — F329 Major depressive disorder, single episode, unspecified: Secondary | ICD-10-CM | POA: Insufficient documentation

## 2017-05-21 DIAGNOSIS — H8110 Benign paroxysmal vertigo, unspecified ear: Secondary | ICD-10-CM | POA: Diagnosis not present

## 2017-05-21 DIAGNOSIS — N200 Calculus of kidney: Secondary | ICD-10-CM | POA: Insufficient documentation

## 2017-05-21 DIAGNOSIS — R2681 Unsteadiness on feet: Secondary | ICD-10-CM | POA: Insufficient documentation

## 2017-05-21 DIAGNOSIS — I700 Atherosclerosis of aorta: Secondary | ICD-10-CM | POA: Diagnosis not present

## 2017-05-21 DIAGNOSIS — Z7982 Long term (current) use of aspirin: Secondary | ICD-10-CM | POA: Insufficient documentation

## 2017-05-21 DIAGNOSIS — R42 Dizziness and giddiness: Secondary | ICD-10-CM

## 2017-05-21 DIAGNOSIS — E162 Hypoglycemia, unspecified: Secondary | ICD-10-CM | POA: Diagnosis not present

## 2017-05-21 DIAGNOSIS — E785 Hyperlipidemia, unspecified: Secondary | ICD-10-CM | POA: Insufficient documentation

## 2017-05-21 DIAGNOSIS — Z86718 Personal history of other venous thrombosis and embolism: Secondary | ICD-10-CM | POA: Insufficient documentation

## 2017-05-21 DIAGNOSIS — F419 Anxiety disorder, unspecified: Secondary | ICD-10-CM | POA: Diagnosis not present

## 2017-05-21 DIAGNOSIS — Z961 Presence of intraocular lens: Secondary | ICD-10-CM | POA: Diagnosis not present

## 2017-05-21 DIAGNOSIS — I6523 Occlusion and stenosis of bilateral carotid arteries: Secondary | ICD-10-CM | POA: Insufficient documentation

## 2017-05-21 DIAGNOSIS — M160 Bilateral primary osteoarthritis of hip: Secondary | ICD-10-CM | POA: Diagnosis not present

## 2017-05-21 DIAGNOSIS — Z8673 Personal history of transient ischemic attack (TIA), and cerebral infarction without residual deficits: Secondary | ICD-10-CM | POA: Diagnosis not present

## 2017-05-21 DIAGNOSIS — Z9889 Other specified postprocedural states: Secondary | ICD-10-CM | POA: Diagnosis not present

## 2017-05-21 LAB — URINALYSIS, COMPLETE (UACMP) WITH MICROSCOPIC
Bacteria, UA: NONE SEEN
Bilirubin Urine: NEGATIVE
GLUCOSE, UA: 50 mg/dL — AB
Hgb urine dipstick: NEGATIVE
Ketones, ur: NEGATIVE mg/dL
Leukocytes, UA: NEGATIVE
Nitrite: NEGATIVE
PH: 6 (ref 5.0–8.0)
Protein, ur: 30 mg/dL — AB
Specific Gravity, Urine: 1.014 (ref 1.005–1.030)
Squamous Epithelial / LPF: NONE SEEN

## 2017-05-21 LAB — COMPREHENSIVE METABOLIC PANEL
ALK PHOS: 56 U/L (ref 38–126)
ALT: 16 U/L (ref 14–54)
AST: 28 U/L (ref 15–41)
Albumin: 4.3 g/dL (ref 3.5–5.0)
Anion gap: 11 (ref 5–15)
BILIRUBIN TOTAL: 0.6 mg/dL (ref 0.3–1.2)
BUN: 10 mg/dL (ref 6–20)
CALCIUM: 9.5 mg/dL (ref 8.9–10.3)
CO2: 28 mmol/L (ref 22–32)
CREATININE: 0.53 mg/dL (ref 0.44–1.00)
Chloride: 102 mmol/L (ref 101–111)
GFR calc non Af Amer: >60 mL/min (ref >60)
Glucose, Bld: 160 mg/dL — ABNORMAL HIGH (ref 65–99)
Potassium: 3.8 mmol/L (ref 3.5–5.1)
SODIUM: 141 mmol/L (ref 135–145)
Total Protein: 7.9 g/dL (ref 6.5–8.1)

## 2017-05-21 LAB — CBC
HCT: 41.4 % (ref 35.0–47.0)
Hemoglobin: 13.9 g/dL (ref 12.0–16.0)
MCH: 30.4 pg (ref 26.0–34.0)
MCHC: 33.7 g/dL (ref 32.0–36.0)
MCV: 90.3 fL (ref 80.0–100.0)
PLATELETS: 182 10*3/uL (ref 150–440)
RBC: 4.59 MIL/uL (ref 3.80–5.20)
RDW: 13.8 % (ref 11.5–14.5)
WBC: 8.4 10*3/uL (ref 3.6–11.0)

## 2017-05-21 LAB — TROPONIN I: Troponin I: <0.03 ng/mL (ref <0.03)

## 2017-05-21 LAB — LIPASE, BLOOD: LIPASE: 31 U/L (ref 11–51)

## 2017-05-21 MED ORDER — SODIUM CHLORIDE 0.9 % IV SOLN
INTRAVENOUS | Status: DC
  Administered 2017-05-21 – 2017-05-22 (×2): via INTRAVENOUS

## 2017-05-21 MED ORDER — LORAZEPAM 0.5 MG PO TABS
0.5000 mg | ORAL_TABLET | Freq: Two times a day (BID) | ORAL | Status: DC
  Administered 2017-05-21 – 2017-05-22 (×3): 0.5 mg via ORAL
  Filled 2017-05-21 (×4): qty 1

## 2017-05-21 MED ORDER — ENOXAPARIN SODIUM 40 MG/0.4ML ~~LOC~~ SOLN
40.0000 mg | SUBCUTANEOUS | Status: DC
  Administered 2017-05-22: 40 mg via SUBCUTANEOUS
  Filled 2017-05-21: qty 0.4

## 2017-05-21 MED ORDER — IOPAMIDOL (ISOVUE-300) INJECTION 61%
75.0000 mL | Freq: Once | INTRAVENOUS | Status: AC | PRN
  Administered 2017-05-21: 75 mL via INTRAVENOUS

## 2017-05-21 MED ORDER — LORAZEPAM 0.5 MG PO TABS
0.5000 mg | ORAL_TABLET | Freq: Once | ORAL | Status: AC
  Administered 2017-05-21: 0.5 mg via ORAL
  Filled 2017-05-21: qty 1

## 2017-05-21 MED ORDER — STROKE: EARLY STAGES OF RECOVERY BOOK: Freq: Once | Status: DC

## 2017-05-21 MED ORDER — ACETAMINOPHEN 325 MG PO TABS: 650.0000 mg | ORAL_TABLET | ORAL | Status: DC | PRN

## 2017-05-21 MED ORDER — ACETAMINOPHEN 160 MG/5ML PO SOLN
650.0000 mg | ORAL | Status: DC | PRN
  Filled 2017-05-21: qty 20.3

## 2017-05-21 MED ORDER — ASPIRIN 81 MG PO CHEW
324.0000 mg | CHEWABLE_TABLET | Freq: Once | ORAL | Status: AC
  Administered 2017-05-21: 324 mg via ORAL
  Filled 2017-05-21: qty 4

## 2017-05-21 MED ORDER — ACETAMINOPHEN 650 MG RE SUPP: 650.0000 mg | RECTAL | Status: DC | PRN

## 2017-05-21 MED ORDER — SIMVASTATIN 10 MG PO TABS
40.0000 mg | ORAL_TABLET | Freq: Every day | ORAL | Status: DC
  Administered 2017-05-22: 40 mg via ORAL
  Filled 2017-05-21: qty 4

## 2017-05-21 MED ORDER — ASPIRIN EC 81 MG PO TBEC
81.0000 mg | DELAYED_RELEASE_TABLET | Freq: Every day | ORAL | Status: DC
  Administered 2017-05-22: 81 mg via ORAL
  Filled 2017-05-21 (×2): qty 1

## 2017-05-21 MED ORDER — SODIUM CHLORIDE 0.9 % IV BOLUS (SEPSIS)
1000.0000 mL | Freq: Once | INTRAVENOUS | Status: AC
  Administered 2017-05-21: 1000 mL via INTRAVENOUS

## 2017-05-21 MED ORDER — MECLIZINE HCL 25 MG PO TABS
12.5000 mg | ORAL_TABLET | Freq: Two times a day (BID) | ORAL | Status: DC | PRN
  Filled 2017-05-21: qty 0.5

## 2017-05-21 NOTE — H&P (Signed)
[facility] [place] at [facility]   PATIENT NAME: Margaret Stephens    MR#:  [id number]  DATE OF BIRTH:  [birth year]  DATE OF ADMISSION:  05/21/2017  PRIMARY CARE PHYSICIAN: Rafael Bihari, MD   REQUESTING/REFERRING PHYSICIAN: Dr. Willy Eddy  CHIEF COMPLAINT:dizziness, ambulatory difficulty   Chief Complaint  Patient presents with  . Diarrhea  . Dizziness  . Emesis    HISTORY OF PRESENT ILLNESS:  Margaret Stephens  is a 83 y.o. female withpast medical history brought in by family because of dizziness, ambulatory difficulty. Patient had diarrhea yesterday, nausea and vomiting today. Started having dizziness this afternoon associated with difficulty ambulating. No facial droop or blurred vision. No weakness in hands or legs. Patient has dizziness mainly positional more when she moves her head.  PAST MEDICAL HISTORY:  History reviewed. No pertinent past medical history.past medical history significant for history of osteoporosis, previous DVT., hyperlipidemia, depression, anxiety  PAST SURGICAL HISTOIRY:   Past Surgical History:  Procedure Laterality Date  . KNEE ARTHROSCOPY Left   . RECTAL SURGERY      SOCIAL HISTORY:   Social History  Substance Use Topics  . Smoking status: Never Smoker  . Smokeless tobacco: Never Used  . Alcohol use No    FAMILY HISTORY:  No family history on file.  DRUG ALLERGIES:  No Known Allergies  REVIEW OF SYSTEMS:  CONSTITUTIONAL: No fever, fatigue or weakness.  EYES: No blurred or double vision.  EARS, NOSE, AND THROAT: No tinnitus or ear pain.  RESPIRATORY: No cough, shortness of breath, wheezing or hemoptysis.  CARDIOVASCULAR: No chest pain, orthopnea, edema.  GASTROINTESTINAL: No nausea, vomiting, diarrhea or abdominal pain.  GENITOURINARY: No dysuria, hematuria.  ENDOCRINE: No polyuria, nocturia,  HEMATOLOGY: No anemia, easy bruising or bleeding SKIN: No rash or lesion. MUSCULOSKELETAL: No joint pain or  arthritis.   NEUROLOGIC: No tingling, numbness, weakness. Patient has dizziness when she moves her head.PSYCHIATRY: No anxiety or depression.   MEDICATIONS AT HOME:   Prior to Admission medications   Medication Sig Start Date End Date Taking? Authorizing Provider  alendronate (FOSAMAX) 70 MG tablet Take 70 mg by mouth once a week. Take with a full glass of water on an empty stomach.   Yes [provider]  aspirin EC 81 MG tablet Take 81 mg by mouth daily.   Yes [provider]  LORazepam (ATIVAN) 0.5 MG tablet Take 0.5 mg by mouth 2 (two) times daily.    Yes [provider]  simvastatin (ZOCOR) 40 MG tablet Take 40 mg by mouth daily.   Yes [provider]  traMADol (ULTRAM) 50 MG tablet Take 1 tablet (50 mg total) by mouth every 6 (six) hours as needed. 12/19/16  Yes Triplett, Cari B, FNP      VITAL SIGNS:  Blood pressure 139/88, pulse 83, temperature (!) 97.5 F (36.4 C), temperature source Oral, resp. rate 16, height 4\' 11"  (1.499 m), weight 54 kg (119 lb), SpO2 96 %.  PHYSICAL EXAMINATION:  GENERAL:  83 y.o.-year-old patient lying in the bed with no acute distress.  EYES: Pupils equal, round, reactive to light and accommodation. No scleral icterus. Extraocular muscles intact.  HEENT: Head atraumatic, normocephalic. Oropharynx and nasopharynx clear.  NECK:  Supple, no jugular venous distention. No thyroid enlargement, no tenderness.  LUNGS: Normal breath sounds bilaterally, no wheezing, rales,rhonchi or crepitation. No use of accessory muscles of respiration.  CARDIOVASCULAR: S1, S2 normal. No murmurs, rubs, or gallops.  ABDOMEN:  Soft, nontender, nondistended. Bowel sounds present. No organomegaly or mass.  EXTREMITIES: No pedal edema, cyanosis, or clubbing.  NEUROLOGIC: Cranial nerves II through XII are intact. Muscle strength 5/5 in all extremities. Sensation intact. Gait not checked.  PSYCHIATRIC: The patient is alert and oriented x 3.  SKIN: No  obvious rash, lesion, or ulcer.   LABORATORY PANEL:   CBC  Recent Labs Lab 05/21/17 1127  WBC 8.4  HGB 13.9  HCT 41.4  PLT 182   ------------------------------------------------------------------------------------------------------------------  Chemistries   Recent Labs Lab 05/21/17 1127  NA 141  K 3.8  CL 102  CO2 28  GLUCOSE 160*  BUN 10  CREATININE 0.53  CALCIUM 9.5  AST 28  ALT 16  ALKPHOS 56  BILITOT 0.6   ------------------------------------------------------------------------------------------------------------------  Cardiac Enzymes  Recent Labs Lab 05/21/17 1127  TROPONINI <0.03   ------------------------------------------------------------------------------------------------------------------  RADIOLOGY:  Ct Head Wo Contrast  Result Date: 05/21/2017 CLINICAL DATA:  Nausea vomiting and diarrhea EXAM: CT HEAD WITHOUT CONTRAST TECHNIQUE: Contiguous axial images were obtained from the base of the skull through the vertex without intravenous contrast. COMPARISON:  None. FINDINGS: Brain: No acute territorial infarction, hemorrhage, or intracranial mass is visualized. Atrophy and small vessel ischemic changes of the white matter. The ventricles are within normal limits given atrophy. Vascular: No hyperdense vessels.  Carotid artery calcification. Skull: Normal. Negative for fracture or focal lesion. Sinuses/Orbits: No acute finding.  Bilateral lens extraction Other: None IMPRESSION: No CT evidence for acute intracranial abnormality. Atrophy and small vessel ischemic changes of the white matter Electronically Signed   By: Jasmine PangKim  Fujinaga M.D.   On: 05/21/2017 15:03   Ct Abdomen Pelvis W Contrast  Result Date: 05/21/2017 CLINICAL DATA:  Nausea vomiting with diarrhea. Dizziness and chills. EXAM: CT ABDOMEN AND PELVIS WITH CONTRAST TECHNIQUE: Multidetector CT imaging of the abdomen and pelvis was performed using the standard protocol following bolus administration  of intravenous contrast. CONTRAST:  75mL ISOVUE-300 IOPAMIDOL (ISOVUE-300) INJECTION 61% COMPARISON:  Esophagram 09/26/2007.  No prior CT. FINDINGS: Lower chest: Minimal motion degradation at the lung bases. Left base scarring. Large hiatal hernia, with approximately 2/3 of the stomach in the chest. Hepatobiliary: Normal liver. Normal gallbladder, without biliary ductal dilatation. Pancreas: Normal, without mass or ductal dilatation. Spleen: Normal in size, without focal abnormality. Adrenals/Urinary Tract: Normal adrenal glands. 5 mm interpolar right renal collecting system calculus. Normal left kidney, without hydronephrosis. Normal urinary bladder. Stomach/Bowel: Hiatal hernia, as above. Normal colon and terminal ileum. The appendix is normal, including on coronal image 18. Normal small bowel. Vascular/Lymphatic: Advanced aortic and branch vessel atherosclerosis. No abdominopelvic adenopathy. Reproductive: Normal uterus and adnexa. Other: No significant free fluid.  Moderate pelvic floor laxity. Musculoskeletal: Osteopenia.  Bilateral hip osteoarthritis. IMPRESSION: 1.  No acute process in the abdomen or pelvis. 2. Large hiatal hernia. 3. Right nephrolithiasis. 4.  Aortic Atherosclerosis (ICD10-I70.0). Electronically Signed   By: Jeronimo GreavesKyle  Talbot M.D.   On: 05/21/2017 15:14    EKG:   Orders placed or performed during the hospital encounter of 05/21/17  . EKG 12-Lead  . EKG 12-Lead   EKG shows sinus rhythm at 70 bpm, no ST T changes. IMPRESSION AND PLAN:  #251.83 year old female patient with the dizziness likely secondary to positional vertigo. Also rule out basilar stroke. Admitted to stroke unit, check brain MRI, ultrasound of carotids, echocardiogram,NIHstroke scale, PT evaluation.check swallow evaluation, start aspirin, statins, check hemoglobin A1c. #2 diarrhea, vomitingand possible dehydration causing dizziness: Continue fluids. Patient is resolved, no more vomiting. Never  had abdominal pain. No  fever. #3/ likely positional vertigo: Add meclizine. #4 .hyperlipidemia: Continue statin if she passes swallow evaluation.  D/w DIL   All the records are reviewed and case discussed with ED provider. Management plans discussed with the patient, family and they are in agreement.  CODE STATUS: full  TOTAL TIME TAKING CARE OF THIS PATIENT: 55 minutes.    Katha Hamming M.D on 05/21/2017 at 5:00 PM  Between 7am to 6pm - Pager - [phone]  After 6pm go to [email or web address] - password [email or web address]  [facility]  Office  [phone]  CC: Primary care physician; Rafael Bihari, MD  Note: This dictation was prepared with Dragon dictation along with smaller phrase technology. Any transcriptional errors that result from this process are unintentional.

## 2017-05-21 NOTE — ED Notes (Signed)
N&V with diarrhea, now dizziness with chills.  Given warm blanket.  Alert and oriented.

## 2017-05-21 NOTE — ED Provider Notes (Signed)
[facility] Emergency Department Provider Note    First MD Initiated Contact with Patient 05/21/17 1314     (approximate)  I have reviewed the triage vital signs and the nursing notes.   HISTORY  Chief Complaint Diarrhea; Dizziness; and Emesis    HPI Margaret Stephens is a 83 y.o. female presents with nausea vomiting with dizziness and difficulty ambulating.  Patient lives at home and typically ambulates with out any assistive device.  Denies any fevers.  No chest pain or shortness of breath.  No recent medication changes.  Denies any numbness or tingling but simply has difficulty ambulating and is nearly falling over due to severe dizziness.  Denies any history of vertigo.  History reviewed. No pertinent past medical history. No family history on file. Past Surgical History:  Procedure Laterality Date  . KNEE ARTHROSCOPY Left   . RECTAL SURGERY     There are no active problems to display for this patient.     Prior to Admission medications   Medication Sig Start Date End Date Taking? Authorizing Provider  alendronate (FOSAMAX) 70 MG tablet Take 70 mg by mouth once a week. Take with a full glass of water on an empty stomach.   Yes [provider]  aspirin EC 81 MG tablet Take 81 mg by mouth daily.   Yes [provider]  LORazepam (ATIVAN) 0.5 MG tablet Take 0.5 mg by mouth 2 (two) times daily.    Yes [provider]  simvastatin (ZOCOR) 40 MG tablet Take 40 mg by mouth daily.   Yes [provider]  traMADol (ULTRAM) 50 MG tablet Take 1 tablet (50 mg total) by mouth every 6 (six) hours as needed. 12/19/16  Yes Triplett, Kasandra Knudsenari B, FNP    Allergies Patient has no known allergies.    Social History Social History  Substance Use Topics  . Smoking status: Never Smoker  . Smokeless tobacco: Never Used  . Alcohol use No    Review of Systems Patient denies headaches, rhinorrhea, blurry vision, numbness, shortness of  breath, chest pain, edema, cough, abdominal pain, nausea, vomiting, diarrhea, dysuria, fevers, rashes or hallucinations unless otherwise stated above in HPI. ____________________________________________   PHYSICAL EXAM:  VITAL SIGNS: Vitals:   05/21/17 1114  BP: (!) 156/73  Pulse: 72  Resp: 16  Temp: (!) 97.5 F (36.4 C)  SpO2: 96%    Constitutional: Alert and oriented.  Elderly and frail-appearing and in no acute distress. Eyes: Conjunctivae are normal.  Head: Atraumatic. Nose: No congestion/rhinnorhea. Mouth/Throat: Mucous membranes are moist.   Neck: No stridor. Painless ROM.  Cardiovascular: Normal rate, regular rhythm. Grossly normal heart sounds.  Good peripheral circulation. Respiratory: Normal respiratory effort.  No retractions. Lungs CTAB. Gastrointestinal: Soft and nontender. No distention. No abdominal bruits. No CVA tenderness. Musculoskeletal: No lower extremity tenderness nor edema.  No joint effusions. Neurologic:  Normal speech and language. No gross focal neurologic deficits are appreciated. No facial droop.  Patient with very unsteady gait and unable to ambulate without two-person assistance.  Patient with antalgic gait. Skin:  Skin is warm, dry and intact. No rash noted. Psychiatric: Mood and affect are normal. Speech and behavior are normal.  ____________________________________________   LABS (all labs ordered are listed, but only abnormal results are displayed)  Results for orders placed or performed during the hospital encounter of 05/21/17 (from the past 24 hour(s))  Lipase, blood     Status: None   Collection Time: 05/21/17 11:27 AM  Result Value Ref Range   Lipase 31 11 - 51 U/L  Comprehensive metabolic panel     Status: Abnormal   Collection Time: 05/21/17 11:27 AM  Result Value Ref Range   Sodium 141 135 - 145 mmol/L   Potassium 3.8 3.5 - 5.1 mmol/L   Chloride 102 101 - 111 mmol/L   CO2 28 22 - 32 mmol/L   Glucose, Bld 160 (H) 65 - 99 mg/dL    BUN 10 6 - 20 mg/dL   Creatinine, Ser 9.81 0.44 - 1.00 mg/dL   Calcium 9.5 8.9 - 19.1 mg/dL   Total Protein 7.9 6.5 - 8.1 g/dL   Albumin 4.3 3.5 - 5.0 g/dL   AST 28 15 - 41 U/L   ALT 16 14 - 54 U/L   Alkaline Phosphatase 56 38 - 126 U/L   Total Bilirubin 0.6 0.3 - 1.2 mg/dL   GFR calc non Af Amer >60 >60 mL/min   GFR calc Af Amer >60 >60 mL/min   Anion gap 11 5 - 15  CBC     Status: None   Collection Time: 05/21/17 11:27 AM  Result Value Ref Range   WBC 8.4 3.6 - 11.0 K/uL   RBC 4.59 3.80 - 5.20 MIL/uL   Hemoglobin 13.9 12.0 - 16.0 g/dL   HCT 47.8 29.5 - 62.1 %   MCV 90.3 80.0 - 100.0 fL   MCH 30.4 26.0 - 34.0 pg   MCHC 33.7 32.0 - 36.0 g/dL   RDW 30.8 65.7 - 84.6 %   Platelets 182 150 - 440 K/uL  Urinalysis, Complete w Microscopic     Status: Abnormal   Collection Time: 05/21/17 11:27 AM  Result Value Ref Range   Color, Urine YELLOW (A) YELLOW   APPearance CLEAR (A) CLEAR   Specific Gravity, Urine 1.014 1.005 - 1.030   pH 6.0 5.0 - 8.0   Glucose, UA 50 (A) NEGATIVE mg/dL   Hgb urine dipstick NEGATIVE NEGATIVE   Bilirubin Urine NEGATIVE NEGATIVE   Ketones, ur NEGATIVE NEGATIVE mg/dL   Protein, ur 30 (A) NEGATIVE mg/dL   Nitrite NEGATIVE NEGATIVE   Leukocytes, UA NEGATIVE NEGATIVE   RBC / HPF 0-5 0 - 5 RBC/hpf   WBC, UA 0-5 0 - 5 WBC/hpf   Bacteria, UA NONE SEEN NONE SEEN   Squamous Epithelial / LPF NONE SEEN NONE SEEN   Mucus PRESENT   Troponin I     Status: None   Collection Time: 05/21/17 11:27 AM  Result Value Ref Range   Troponin I <0.03 <0.03 ng/mL   ____________________________________________  EKG My review and personal interpretation at Time: 11:32   Indication: 70  Rate: sinus  Rhythm: normal Axis: normal  Other: no stemi, normal intervals ____________________________________________  RADIOLOGY  I personally reviewed all radiographic images ordered to evaluate for the above acute complaints and reviewed radiology reports and findings.  These  findings were personally discussed with the patient.  Please see medical record for radiology report.  ____________________________________________   PROCEDURES  Procedure(s) performed:  Procedures    Critical Care performed: no ____________________________________________   INITIAL IMPRESSION / ASSESSMENT AND PLAN / ED COURSE  Pertinent labs & imaging results that were available during my care of the patient were reviewed by me and considered in my medical decision making (see chart for details).  DDX: cva, tia, hypoglycemia, dehydration, electrolyte abnormality, dissection, sepsis   SHERINA STAMMER is a 83 y.o. who presents to the ED with symptoms as  described above.  Blood work is fairly reassuring.  No evidence of UTI or significant electrolyte abnormality.  CT head shows no acute abnormality.  CT abdomen ordered to evaluate for evidence of obstructive process or other cause of her nausea vomiting.  Seems that the majority of her symptoms are related to unsteadiness and dizziness.  Given her inability to ambulate I am concerned for posterior circulation ischemia.  Onset of symptoms would be outside of the window for TPA.  At this point do feel the patient would be best served with admission to the hospital for further hemodynamic monitoring and MRI.  Have discussed with the patient and available family all diagnostics and treatments performed thus far and all questions were answered to the best of my ability. The patient demonstrates understanding and agreement with plan.       ____________________________________________   FINAL CLINICAL IMPRESSION(S) / ED DIAGNOSES  Final diagnoses:  Dizziness  Unsteady gait      NEW MEDICATIONS STARTED DURING THIS VISIT:  New Prescriptions   No medications on file     Note:  This document was prepared using Dragon voice recognition software and may include unintentional dictation errors.    Willy Eddy, MD 05/21/17  1600

## 2017-05-22 ENCOUNTER — Observation Stay
Admit: 2017-05-22 | Discharge: 2017-05-22 | Disposition: A | Discharge status: Home / Self Care | Payer: Medicare Other | Attending: Internal Medicine | Admitting: Internal Medicine

## 2017-05-22 ENCOUNTER — Observation Stay: Payer: Medicare Other

## 2017-05-22 DIAGNOSIS — H8110 Benign paroxysmal vertigo, unspecified ear: Secondary | ICD-10-CM | POA: Diagnosis not present

## 2017-05-22 LAB — LIPID PANEL
Cholesterol: 137 mg/dL (ref 0–200)
HDL: 50 mg/dL (ref >40)
LDL CALC: 75 mg/dL (ref 0–99)
TRIGLYCERIDES: 58 mg/dL (ref <150)
Total CHOL/HDL Ratio: 2.7 RATIO
VLDL: 12 mg/dL (ref 0–40)

## 2017-05-22 MED ORDER — MECLIZINE HCL 12.5 MG PO TABS
12.5000 mg | ORAL_TABLET | Freq: Three times a day (TID) | ORAL | Status: DC
  Administered 2017-05-22 (×3): 12.5 mg via ORAL
  Filled 2017-05-22 (×4): qty 1

## 2017-05-22 MED ORDER — MECLIZINE HCL 12.5 MG PO TABS
12.5000 mg | ORAL_TABLET | Freq: Three times a day (TID) | ORAL | Status: DC | PRN
  Filled 2017-05-22: qty 1

## 2017-05-22 NOTE — Care Management Obs Status (Signed)
MEDICARE OBSERVATION STATUS NOTIFICATION   Patient Details  Name: Margaret Stephens MRN: [id number] Date of Birth: 10-Jun-1925   Medicare Observation Status Notification Given:  Yes    Solymar Grace A, RN 05/22/2017, 3:57 PM

## 2017-05-22 NOTE — Progress Notes (Signed)
SLP Cancellation Note  Patient Details Name: Margaret Stephens MRN: [id number] DOB: Dec 21, 1924   Cancelled treatment:       Reason Eval/Treat Not Completed: SLP screened, no needs identified, will sign off (chart reviewed; consulted NSG, MD who felt inner ear). Pt is denying any difficulty swallowing and is currently on a regular diet; tolerates swallowing pills w/ water per NSG report. Pt converses at conversational level w/out deficits noted by NSG; pt denies any speech-language deficits to NSG.  No further skilled ST services indicated as pt appears at her baseline. NSG to reconsult if any change in status.     Jerilynn SomKatherine Cannen Dupras, MS, CCC-SLP Fremon Zacharia 05/22/2017, 9:15 AM

## 2017-05-22 NOTE — Evaluation (Addendum)
Occupational Therapy Evaluation Patient Details Name: Margaret Stephens MRN: [id number] DOB: 1925-07-21 Today's Date: 05/22/2017    History of Present Illness 83 yo female pt admitted on 10/19 to ED with dizziness, emesis, and diarrhea. MRI/CT negative for acute infarct. Abdominal CT shows large hiatal hernia and R nephrolithiasis.    Clinical Impression   Pt seen for OT evaluation this date. Prior to admission, pt living by herself with family next door who are in and out frequently. Pt was independent with mobility (no AD, does "furniture walk"), ADL tasks, family drives, and no falls in past 12 months. Keeps house phone in her pocket at all times at home. Pt presents with dizziness with head movement, no nystagmus noted during assessment, and able to perform self care tasks with supervision. Pt cautious with movement. Pt/family educated in AE/DME, home/routines modifications, positioning, and falls prevention strategies to maximize safety and functional independence with ADL tasks. Pt/family verbalized understanding. Pt/family feel confident in return home with family supports as needed and HHPT. No additional skilled OT needs at this time, do not anticipate any skilled OT needs at discharge. Will sign off. Please re-consult if additional needs arise.     Follow Up Recommendations  No OT follow up    Equipment Recommendations  None recommended by OT    Recommendations for Other Services       Precautions / Restrictions Precautions Precautions: Fall Restrictions Weight Bearing Restrictions: No      Mobility Bed Mobility Overal bed mobility: Modified Independent             General bed mobility comments: cautious with movement, no physical assist needed  Transfers Overall transfer level: Needs assistance Equipment used: Rolling walker (2 wheeled) Transfers: Sit to/from Stand Sit to Stand: Supervision              Balance Overall balance assessment: Needs  assistance Sitting-balance support: Feet supported;No upper extremity supported Sitting balance-Leahy Scale: Good     Standing balance support: Bilateral upper extremity supported Standing balance-Leahy Scale: Good                             ADL either performed or assessed with clinical judgement   ADL Overall ADL's : Modified independent                                       General ADL Comments: pt able to perform self care tasks with supervision, pt cautious with movement. Pt/family educated in AE/DME, home/routines modifications, positioning, and falls prevention strategies to maximize safety and functional independence with ADL tasks. Pt/family verbalized understanding.     Vision Baseline Vision/History: Wears glasses Wears Glasses: At all times Patient Visual Report: No change from baseline Vision Assessment?: No apparent visual deficits     Perception     Praxis      Pertinent Vitals/Pain Pain Assessment: No/denies pain     Hand Dominance     Extremity/Trunk Assessment Upper Extremity Assessment Upper Extremity Assessment: Overall WFL for tasks assessed (grossly WFL for age)   Lower Extremity Assessment Lower Extremity Assessment: Overall WFL for tasks assessed;Defer to PT evaluation (grossly WFL for age)   Cervical / Trunk Assessment Cervical / Trunk Assessment: Normal   Communication Communication Communication: No difficulties   Cognition Arousal/Alertness: Awake/alert Behavior During Therapy: WFL for tasks assessed/performed Overall Cognitive Status: Within  Functional Limits for tasks assessed                                     General Comments       Exercises     Shoulder Instructions      Home Living Family/patient expects to be discharged to:: Private residence Living Arrangements: Alone Available Help at Discharge: Family;Available PRN/intermittently (family lives on either side of pt, in and  out all the time) Type of Home: House Home Access: Stairs to enter Entergy CorporationEntrance Stairs-Number of Steps: 1 threshold step from garage Entrance Stairs-Rails: None Home Layout: One level     Bathroom Shower/Tub: Chief Strategy OfficerTub/shower unit   Bathroom Toilet: Handicapped height (BSC frame over toilet)     Home Equipment: Environmental consultantWalker - 2 wheels;Bedside commode;Cane - single point   Additional Comments: doesn't use tub/shower, she takes sponge bath while seated on commode      Prior Functioning/Environment Level of Independence: Independent        Comments: pt independent with mobility (no AD, does "furniture walk"), ADL tasks, family drives, and no falls in past 12 months. Keeps house phone in her pocket at all times at home.        OT Problem List:        OT Treatment/Interventions:      OT Goals(Current goals can be found in the care plan section) Acute Rehab OT Goals Patient Stated Goal: go home OT Goal Formulation: All assessment and education complete, DC therapy  OT Frequency:     Barriers to D/C:            Co-evaluation              AM-PAC PT "6 Clicks" Daily Activity     Outcome Measure Help from another person eating meals?: None Help from another person taking care of personal grooming?: None Help from another person toileting, which includes using toliet, bedpan, or urinal?: A Little Help from another person bathing (including washing, rinsing, drying)?: A Little Help from another person to put on and taking off regular upper body clothing?: A Little Help from another person to put on and taking off regular lower body clothing?: A Little 6 Click Score: 20   End of Session Equipment Utilized During Treatment: Rolling walker  Activity Tolerance: Patient tolerated treatment well Patient left: in bed;with call bell/phone within reach;with bed alarm set;with family/visitor present  OT Visit Diagnosis: Other abnormalities of gait and mobility (R26.89)                 Time: [id number] OT Time Calculation (min): 17 min Charges:  OT General Charges $OT Visit: 1 Visit OT Evaluation $OT Eval Low Complexity: 1 Low OT Treatments $Self Care/Home Management : 8-22 mins G-Codes: OT G-codes **NOT FOR INPATIENT CLASS** Functional Assessment Tool Used: AM-PAC 6 Clicks Daily Activity;Clinical judgement Functional Limitation: Self care Self Care Current Status ([id number](G8987): At least 1 percent but less than 20 percent impaired, limited or restricted Self Care Goal Status ([id number](G8988): At least 1 percent but less than 20 percent impaired, limited or restricted Self Care Discharge Status [phone](G8989): At least 1 percent but less than 20 percent impaired, limited or restricted   Richrd PrimeJamie Stiller, MPH, MS, OTR/L ascom [phone] 05/22/17, 11:22 AM

## 2017-05-22 NOTE — Progress Notes (Signed)
*  PRELIMINARY RESULTS* Echocardiogram 2D Echocardiogram has been performed.  Margaret Stephens Duma 05/22/2017, 4:26 PM

## 2017-05-22 NOTE — Progress Notes (Addendum)
[employer] - [place] [facility]   PATIENT NAME: Margaret Stephens    MR#:  [id number]  DATE OF BIRTH:  1925/06/14  SUBJECTIVE:  CHIEF COMPLAINT:   Chief Complaint  Patient presents with  . Diarrhea  . Dizziness  . Emesis   - Still complains of significant dizziness on head turning. -Also associated with nausea at times  REVIEW OF SYSTEMS:  Review of Systems  Constitutional: Negative for chills, fever and malaise/fatigue.  HENT: Negative for congestion, ear discharge, hearing loss and nosebleeds.   Eyes: Negative for blurred vision and double vision.  Respiratory: Negative for cough, shortness of breath and wheezing.   Cardiovascular: Negative for chest pain, palpitations and leg swelling.  Gastrointestinal: Positive for nausea. Negative for abdominal pain, constipation, diarrhea and vomiting.  Genitourinary: Negative for dysuria and urgency.  Musculoskeletal: Negative for myalgias.  Neurological: Positive for dizziness and weakness. Negative for sensory change, speech change, focal weakness, seizures and headaches.  Psychiatric/Behavioral: Negative for depression.    DRUG ALLERGIES:  No Known Allergies  VITALS:  Blood pressure (!) 142/65, pulse 73, temperature 98.5 F (36.9 C), temperature source Oral, resp. rate 16, height 4\' 11"  (1.499 m), weight 54 kg (119 lb), SpO2 94 %.  PHYSICAL EXAMINATION:  Physical Exam  GENERAL:  83 y.o.-year-old elderly patient lying in the bed with no acute distress.  EYES: Pupils equal, round, reactive to light and accommodation. No scleral icterus. Extraocular muscles intact.  HEENT: Head atraumatic, normocephalic. Oropharynx and nasopharynx clear.  NECK:  Supple, no jugular venous distention. No thyroid enlargement, no tenderness.  LUNGS: Normal breath sounds bilaterally, no wheezing, rales,rhonchi or crepitation. No use of accessory muscles of respiration.  CARDIOVASCULAR: S1, S2 normal. No   rubs, or gallops. 2/6  systolic murmur present ABDOMEN: Soft, nontender, nondistended. Bowel sounds present. No organomegaly or mass.  EXTREMITIES: No pedal edema, cyanosis, or clubbing.  NEUROLOGIC: Cranial nerves II through XII are intact. Muscle strength 5/5 in all extremities. Sensation intact. Gait not checked.  PSYCHIATRIC: The patient is alert and oriented x 3.  SKIN: No obvious rash, lesion, or ulcer.    LABORATORY PANEL:   CBC  Recent Labs Lab 05/21/17 1127  WBC 8.4  HGB 13.9  HCT 41.4  PLT 182   ------------------------------------------------------------------------------------------------------------------  Chemistries   Recent Labs Lab 05/21/17 1127  NA 141  K 3.8  CL 102  CO2 28  GLUCOSE 160*  BUN 10  CREATININE 0.53  CALCIUM 9.5  AST 28  ALT 16  ALKPHOS 56  BILITOT 0.6   ------------------------------------------------------------------------------------------------------------------  Cardiac Enzymes  Recent Labs Lab 05/21/17 1127  TROPONINI <0.03   ------------------------------------------------------------------------------------------------------------------  RADIOLOGY:  Ct Head Wo Contrast  Result Date: 05/21/2017 CLINICAL DATA:  Nausea vomiting and diarrhea EXAM: CT HEAD WITHOUT CONTRAST TECHNIQUE: Contiguous axial images were obtained from the base of the skull through the vertex without intravenous contrast. COMPARISON:  None. FINDINGS: Brain: No acute territorial infarction, hemorrhage, or intracranial mass is visualized. Atrophy and small vessel ischemic changes of the white matter. The ventricles are within normal limits given atrophy. Vascular: No hyperdense vessels.  Carotid artery calcification. Skull: Normal. Negative for fracture or focal lesion. Sinuses/Orbits: No acute finding.  Bilateral lens extraction Other: None IMPRESSION: No CT evidence for acute intracranial abnormality. Atrophy and small vessel ischemic changes of the white matter  Electronically Signed   By: Jasmine PangKim  Fujinaga M.D.   On: 05/21/2017 15:03   Mr Brain Wo Contrast  Result Date: 05/21/2017 CLINICAL  DATA:  Dizziness, diarrhea, nausea and vomiting. Difficulty ambulating. Assess TIA. EXAM: MRI HEAD WITHOUT CONTRAST MRA HEAD WITHOUT CONTRAST TECHNIQUE: Multiplanar, multiecho pulse sequences of the brain and surrounding structures were obtained without intravenous contrast. Angiographic images of the head were obtained using MRA technique without contrast. COMPARISON:  CT HEAD May 21, 2017 at 1440 hours FINDINGS: MRI HEAD FINDINGS BRAIN: No reduced diffusion to suggest acute ischemia. No susceptibility artifact to suggest hemorrhage. The ventricles and sulci are normal for patient's age. Old small RIGHT greater than LEFT cerebellar infarcts. Patchy supratentorial white matter FLAIR T2 hyperintensities compatible with mild chronic small vessel ischemic disease, less than expected for age. Old small LEFT basal ganglia lacunar infarct. No suspicious parenchymal signal, mass or mass effect. No abnormal extra-axial fluid collections. VASCULAR: See below. SKULL AND UPPER CERVICAL SPINE: No abnormal sellar expansion. No suspicious calvarial bone marrow signal. Craniocervical junction maintained. SINUSES/ORBITS: Trace RIGHT mastoid effusion. Paranasal sinuses are well-aerated. The included ocular globes and orbital contents are non-suspicious. Status post bilateral ocular lens implants. OTHER: None. MRA HEAD FINDINGS ANTERIOR CIRCULATION: Normal flow related enhancement of the included cervical, petrous, cavernous and supraclinoid internal carotid arteries. RIGHT cervical tonsillar loop. Patent anterior communicating artery. Patent anterior and middle cerebral arteries, including distal segments. No large vessel occlusion, flow limiting stenosis, aneurysm. POSTERIOR CIRCULATION: RIGHT vertebral artery is dominant. LEFT vertebral artery terminates in the posterior inferior cerebellar  artery. Basilar artery is patent, with normal flow related enhancement of the main branch vessels. Patent posterior cerebral arteries. Fetal origin LEFT posterior cerebral artery. Tiny RIGHT P1 segment. No large vessel occlusion, flow limiting stenosis,  aneurysm. ANATOMIC VARIANTS: Supernumerary anterior cerebral artery arising from LEFT A1-2 junction. Source images and MIP images were reviewed. IMPRESSION: MRI HEAD: 1. No acute intracranial process. 2. Mild chronic small vessel ischemic disease. 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 MRA HEAD: 1. No emergent large vessel occlusion or flow limiting stenosis. Electronically Signed   By: Awilda Metro M.D.   On: 05/21/2017 18:32   Ct Abdomen Pelvis W Contrast  Result Date: 05/21/2017 CLINICAL DATA:  Nausea vomiting with diarrhea. Dizziness and chills. EXAM: CT ABDOMEN AND PELVIS WITH CONTRAST TECHNIQUE: Multidetector CT imaging of the abdomen and pelvis was performed using the standard protocol following bolus administration of intravenous contrast. CONTRAST:  75mL ISOVUE-300 IOPAMIDOL (ISOVUE-300) INJECTION 61% COMPARISON:  Esophagram 09/26/2007.  No prior CT. FINDINGS: Lower chest: Minimal motion degradation at the lung bases. Left base scarring. Large hiatal hernia, with approximately 2/3 of the stomach in the chest. Hepatobiliary: Normal liver. Normal gallbladder, without biliary ductal dilatation. Pancreas: Normal, without mass or ductal dilatation. Spleen: Normal in size, without focal abnormality. Adrenals/Urinary Tract: Normal adrenal glands. 5 mm interpolar right renal collecting system calculus. Normal left kidney, without hydronephrosis. Normal urinary bladder. Stomach/Bowel: Hiatal hernia, as above. Normal colon and terminal ileum. The appendix is normal, including on coronal image 18. Normal small bowel. Vascular/Lymphatic: Advanced aortic and branch vessel atherosclerosis. No abdominopelvic adenopathy. Reproductive: Normal uterus  and adnexa. Other: No significant free fluid.  Moderate pelvic floor laxity. Musculoskeletal: Osteopenia.  Bilateral hip osteoarthritis. IMPRESSION: 1.  No acute process in the abdomen or pelvis. 2. Large hiatal hernia. 3. Right nephrolithiasis. 4.  Aortic Atherosclerosis (ICD10-I70.0). Electronically Signed   By: Jeronimo Greaves M.D.   On: 05/21/2017 15:14   US Carotid Bilateral (at [facility] Only)  Result Date: 05/22/2017 CLINICAL DATA:  Dizziness, syncope, hyperlipidemia EXAM: BILATERAL CAROTID DUPLEX ULTRASOUND TECHNIQUE: Wallace Cullens scale  imaging, color Doppler and duplex ultrasound were performed of bilateral carotid and vertebral arteries in the neck. COMPARISON:  None. FINDINGS: Criteria: Quantification of carotid stenosis is based on velocity parameters that correlate the residual internal carotid diameter with NASCET-based stenosis levels, using the diameter of the distal internal carotid lumen as the denominator for stenosis measurement. The following velocity measurements were obtained: RIGHT ICA:  124/22 cm/sec CCA:  75/15 cm/sec SYSTOLIC ICA/CCA RATIO:  1.7 DIASTOLIC ICA/CCA RATIO:  1.5 ECA:  119 cm/sec LEFT ICA:  97/20 cm/sec CCA:  94/19 cm/sec SYSTOLIC ICA/CCA RATIO:  0.0 DIASTOLIC ICA/CCA RATIO:  1.1 ECA:  102 cm/sec RIGHT CAROTID ARTERY: Heavily calcified right carotid bifurcation with echogenic shadowing plaque. This narrows the proximal ICA lumen by grayscale imaging. Despite this, there is no hemodynamically significant ICA stenosis, velocity elevation, or turbulent flow detected. Degree of narrowing is estimated less than 50% by ultrasound criteria. RIGHT VERTEBRAL ARTERY:  Antegrade LEFT CAROTID ARTERY: Moderate heterogeneous calcified plaque formation. This narrows the proximal ICA lumen by grayscale imaging. Despite this, there is no hemodynamically significant left ICA stenosis, velocity elevation, or turbulent flow detected. Degree of narrowing also less than 50%. LEFT VERTEBRAL ARTERY:   Antegrade IMPRESSION: Bilateral calcific carotid atherosclerosis. No hemodynamically significant ICA stenosis. Degree of narrowing less than 50% bilaterally by ultrasound criteria. Patent antegrade vertebral flow bilaterally Electronically Signed   By: Judie Petit.  Shick M.D.   On: 05/22/2017 09:41   Mr Maxine Glenn Head/brain ZO Cm  Result Date: 05/21/2017 CLINICAL DATA:  Dizziness, diarrhea, nausea and vomiting. Difficulty ambulating. Assess TIA. EXAM: MRI HEAD WITHOUT CONTRAST MRA HEAD WITHOUT CONTRAST TECHNIQUE: Multiplanar, multiecho pulse sequences of the brain and surrounding structures were obtained without intravenous contrast. Angiographic images of the head were obtained using MRA technique without contrast. COMPARISON:  CT HEAD May 21, 2017 at 1440 hours FINDINGS: MRI HEAD FINDINGS BRAIN: No reduced diffusion to suggest acute ischemia. No susceptibility artifact to suggest hemorrhage. The ventricles and sulci are normal for patient's age. Old small RIGHT greater than LEFT cerebellar infarcts. Patchy supratentorial white matter FLAIR T2 hyperintensities compatible with mild chronic small vessel ischemic disease, less than expected for age. Old small LEFT basal ganglia lacunar infarct. No suspicious parenchymal signal, mass or mass effect. No abnormal extra-axial fluid collections. VASCULAR: See below. SKULL AND UPPER CERVICAL SPINE: No abnormal sellar expansion. No suspicious calvarial bone marrow signal. Craniocervical junction maintained. SINUSES/ORBITS: Trace RIGHT mastoid effusion. Paranasal sinuses are well-aerated. The included ocular globes and orbital contents are non-suspicious. Status post bilateral ocular lens implants. OTHER: None. MRA HEAD FINDINGS ANTERIOR CIRCULATION: Normal flow related enhancement of the included cervical, petrous, cavernous and supraclinoid internal carotid arteries. RIGHT cervical tonsillar loop. Patent anterior communicating artery. Patent anterior and middle cerebral arteries,  including distal segments. No large vessel occlusion, flow limiting stenosis, aneurysm. POSTERIOR CIRCULATION: RIGHT vertebral artery is dominant. LEFT vertebral artery terminates in the posterior inferior cerebellar artery. Basilar artery is patent, with normal flow related enhancement of the main branch vessels. Patent posterior cerebral arteries. Fetal origin LEFT posterior cerebral artery. Tiny RIGHT P1 segment. No large vessel occlusion, flow limiting stenosis,  aneurysm. ANATOMIC VARIANTS: Supernumerary anterior cerebral artery arising from LEFT A1-2 junction. Source images and MIP images were reviewed. IMPRESSION: MRI HEAD: 1. No acute intracranial process. 2. Mild chronic small vessel ischemic disease. 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 MRA HEAD: 1. No emergent large vessel occlusion or flow limiting stenosis. Electronically Signed   By: Michel Santee.D.  On: 05/21/2017 18:32    EKG:   Orders placed or performed during the hospital encounter of 05/21/17  . EKG 12-Lead  . EKG 12-Lead    ASSESSMENT AND PLAN:   83 year old female with past medical history significant for osteoporosis, hyperlipidemia, depression and anxiety and remote history of DVT presents to the hospital secondary to dizziness and nausea  #1 dizziness-it is more likely benign positional vertigo -MRI of the brain did not show any acute infarcts, MRA of the brain did not show any hemodynamically significant stenosis. -Carotid Dopplers negative for stenosis as well. -Echocardiogram is pending -Started on meclizine. Continue Ativan twice a day which is her home medication -Physical therapy consult. She will also need outpatient ENT/ PT consult for Epley's maneuver -Urine analysis is negative for infection  #2 hyperlipidemia-statin  #3 anxiety-on low-dose Ativan  #4 DVT prophylaxis-Lovenox  Physical therapy consulted    All the records are reviewed and case discussed with Care  Management/Social Workerr. Management plans discussed with the patient, family and they are in agreement.  CODE STATUS: Full code  TOTAL TIME TAKING CARE OF THIS PATIENT: 37 minutes.   POSSIBLE D/C tomorrow, DEPENDING ON CLINICAL CONDITION.   Lexton Hidalgo M.D on 05/22/2017 at 11:19 AM  Between 7am to 6pm - Pager - [phone]  After 6pm go to [email or web address] - password [email or web address]  [facility]  Office  [phone]  CC: Primary care physician; Rafael Bihari, MD

## 2017-05-22 NOTE — Evaluation (Signed)
Physical Therapy Evaluation Patient Details Name: Margaret Stephens MRN: [id number] DOB: October 26, 1924 Today's Date: 05/22/2017   History of Present Illness  83 yo female pt admitted on 10/19 to ED with dizziness, emesis, and diarrhea. MRI/CT negative for acute infarct. Abdominal CT shows large hiatal hernia and R nephrolithiasis.   Clinical Impression  Pt is with daughter in law and son as PT arrived and was able to discuss follow up care with RW and safety at home.  Pt and her family are aware of her functional level and note she is normally able to walk with no AD in her yard at home if pt wishes.  All stairs to her home are without railing and so will need RW for access at the threshhold door.  Follow acutely and have HHPT follow her for safety of mobility to get in and out of the home.     Follow Up Recommendations Home health PT    Equipment Recommendations  Rolling walker with 5" wheels (if pt does not have a good walker)    Recommendations for Other Services       Precautions / Restrictions Precautions Precautions: Fall (telemetry) Precaution Comments: dizzy at times Restrictions Weight Bearing Restrictions: No      Mobility  Bed Mobility Overal bed mobility: Modified Independent             General bed mobility comments: using bedrail and slow to move legs  Transfers Overall transfer level: Modified independent Equipment used: Rolling walker (2 wheeled) Transfers: Sit to/from Stand Sit to Stand: Supervision;Min guard (for safety in BR, supervised upon standing from commode)            Ambulation/Gait Ambulation/Gait assistance: Min guard (for safety as pt has been dizzy) Ambulation Distance (Feet): 100 Feet Assistive device: Rolling walker (2 wheeled);1 person hand held assist Gait Pattern/deviations: Step-through pattern;Step-to pattern;Trunk flexed;Narrow base of support;Decreased stride length Gait velocity: reduced Gait velocity interpretation: Below  normal speed for age/gender General Gait Details: checked vitals with pt demonstrating no loss of O2 sats with effort  Stairs            Wheelchair Mobility    Modified Rankin (Stroke Patients Only)       Balance Overall balance assessment: Needs assistance Sitting-balance support: Feet supported Sitting balance-Leahy Scale: Fair     Standing balance support: Bilateral upper extremity supported;During functional activity Standing balance-Leahy Scale: Fair                               Pertinent Vitals/Pain Pain Assessment: No/denies pain    Home Living Family/patient expects to be discharged to:: Private residence Living Arrangements: Alone Available Help at Discharge: Family;Available PRN/intermittently Type of Home: House Home Access: Stairs to enter Entrance Stairs-Rails: None Entrance Stairs-Number of Steps: 1 threshold step from garage Home Layout: One level Home Equipment: Environmental consultantWalker - 2 wheels;Bedside commode;Cane - single point Additional Comments: doesn't use tub/shower, she takes sponge bath while seated on commode    Prior Function Level of Independence: Independent         Comments: pt independent with mobility (no AD, does "furniture walk"), ADL tasks, family drives, and no falls in past 12 months. Keeps house phone in her pocket at all times at home.     Hand Dominance        Extremity/Trunk Assessment   Upper Extremity Assessment Upper Extremity Assessment: Overall WFL for tasks assessed  Lower Extremity Assessment Lower Extremity Assessment: Generalized weakness    Cervical / Trunk Assessment Cervical / Trunk Assessment: Normal  Communication   Communication: HOH  Cognition Arousal/Alertness: Awake/alert Behavior During Therapy: WFL for tasks assessed/performed Overall Cognitive Status: Within Functional Limits for tasks assessed                                        General Comments General  comments (skin integrity, edema, etc.): pt has been up to walk with RW and cues although at home only using furniture, not opposed to adding walker    Exercises     Assessment/Plan    PT Assessment Patient needs continued PT services  PT Problem List Decreased strength;Decreased range of motion;Decreased activity tolerance;Decreased balance;Decreased mobility;Decreased coordination;Decreased knowledge of use of DME;Decreased safety awareness       PT Treatment Interventions DME instruction;Gait training;Functional mobility training;Therapeutic activities;Therapeutic exercise;Balance training;Neuromuscular re-education;Patient/family education    PT Goals (Current goals can be found in the Care Plan section)  Acute Rehab PT Goals Patient Stated Goal: go home PT Goal Formulation: With patient/family Time For Goal Achievement: 05/29/17 Potential to Achieve Goals: Good    Frequency Min 2X/week   Barriers to discharge Decreased caregiver support home alone with unsteady gait for last year    Co-evaluation               AM-PAC PT "6 Clicks" Daily Activity  Outcome Measure Difficulty turning over in bed (including adjusting bedclothes, sheets and blankets)?: A Little Difficulty moving from lying on back to sitting on the side of the bed? : A Little Difficulty sitting down on and standing up from a chair with arms (e.g., wheelchair, bedside commode, etc,.)?: A Little Help needed moving to and from a bed to chair (including a wheelchair)?: A Little Help needed walking in hospital room?: A Little Help needed climbing 3-5 steps with a railing? : A Little 6 Click Score: 18    End of Session Equipment Utilized During Treatment: Gait belt Activity Tolerance: Patient tolerated treatment well;Treatment limited secondary to medical complications (Comment) (monitoring dizziness which was not an issue nor was nystagmu) Patient left: in bed;with call bell/phone within reach;with bed alarm  set;with family/visitor present Nurse Communication: Mobility status PT Visit Diagnosis: Muscle weakness (generalized) (M62.81);Unsteadiness on feet (R26.81)    Time: 1610-9604 PT Time Calculation (min) (ACUTE ONLY): 29 min   Charges:   PT Evaluation $PT Eval Moderate Complexity: 1 Mod PT Treatments $Gait Training: 8-22 mins   PT G Codes:   PT G-Codes **NOT FOR INPATIENT CLASS** Functional Assessment Tool Used: AM-PAC 6 Clicks Basic Mobility;Clinical judgement Functional Limitation: Mobility: Walking and moving around Mobility: Walking and Moving Around Current Status (V4098): At least 20 percent but less than 40 percent impaired, limited or restricted Mobility: Walking and Moving Around Goal Status [phone]): At least 1 percent but less than 20 percent impaired, limited or restricted    Ivar Drape 05/22/2017, 12:18 PM   Samul Dada, PT MS Acute Rehab Dept. Number: [facility] [id number] and [facility] [phone]

## 2017-05-23 LAB — ECHOCARDIOGRAM COMPLETE
HEIGHTINCHES: 59 in
Weight: 1904 oz

## 2017-05-23 LAB — HEMOGLOBIN A1C
HEMOGLOBIN A1C: 5.7 % — AB (ref 4.8–5.6)
MEAN PLASMA GLUCOSE: 116.89 mg/dL

## 2017-05-23 MED ORDER — MECLIZINE HCL 12.5 MG PO TABS: 12.5000 mg | ORAL_TABLET | Freq: Three times a day (TID) | ORAL | 0 refills | Status: AC

## 2017-05-23 NOTE — Care Management Note (Addendum)
Case Management Note  Patient Details  Name: Zena Amosnnie M Mares MRN: [id number] Date of Birth: 1925/05/09  Subjective/Objective:   Discussed discharge planning with daughter-in-law who requested that all calls and appointments be set up through Arman BogusKim Payne, grandaughter at [phone]. [facility]. [facility]                Action/Plan:   Expected Discharge Date:  05/23/17               Expected Discharge Plan:  Home w Home Health Services  In-House Referral:  NA  Discharge planning Services  CM Consult  Post Acute Care Choice:  Home Health Choice offered to:  Adult Children  DME Arranged:  N/A DME Agency:  NA  HH Arranged:  RN, PT HH Agency:  [facility]  Status of Service:  Completed, signed off  If discussed at Long Length of Stay Meetings, dates discussed:    Additional Comments:  Delma Drone A, RN 05/23/2017, 10:25 AM

## 2017-05-23 NOTE — Discharge Summary (Signed)
[facility] [place] [place] at [facility]   PATIENT NAME: Margaret Stephens    MR#:  [id number]  DATE OF BIRTH:  [birth year]  DATE OF ADMISSION:  05/21/2017   ADMITTING PHYSICIAN: Katha Hamming, MD  DATE OF DISCHARGE: 05/23/2017 10:44 AM  PRIMARY CARE PHYSICIAN: Rafael Bihari, MD   ADMISSION DIAGNOSIS:   Dizziness [R42] Unsteady gait [R26.81]  DISCHARGE DIAGNOSIS:   Active Problems:   Dizziness   SECONDARY DIAGNOSIS:   History reviewed. No pertinent past medical history.  HOSPITAL COURSE:   83 year old female with past medical history significant for osteoporosis, hyperlipidemia, depression and anxiety and remote history of DVT presents to the hospital secondary to dizziness and nausea  #1 dizziness- benign positional vertigo -MRI of the brain did not show any acute infarcts, MRA of the brain did not show any hemodynamically significant stenosis. -Carotid Dopplers negative for stenosis as well. -Echocardiogram is pending -on meclizine with clinical improvement. Continue Ativan twice a day which is her home medication -Physical therapy consulted. She will also need outpatient ENT/ PT consult for Epley's maneuver if needed -Urine analysis is negative for infection - Improved symptoms clinically  #2 hyperlipidemia-statin  #3 anxiety-on low-dose Ativan   Physical therapy consulted- recommended home health  DISCHARGE CONDITIONS:   Guarded  CONSULTS OBTAINED:   None  DRUG ALLERGIES:   No Known Allergies DISCHARGE MEDICATIONS:   Allergies as of 05/23/2017   No Known Allergies     Medication List    TAKE these medications   alendronate 70 MG tablet Commonly known as:  FOSAMAX Take 70 mg by mouth once a week. Take with a full glass of water on an empty stomach.   aspirin EC 81 MG tablet Take 81 mg by mouth daily.   LORazepam 0.5 MG tablet Commonly known as:  ATIVAN Take 0.5 mg by mouth 2 (two) times daily.   meclizine 12.5  MG tablet Commonly known as:  ANTIVERT Take 1 tablet (12.5 mg total) by mouth 3 (three) times daily.   simvastatin 40 MG tablet Commonly known as:  ZOCOR Take 40 mg by mouth daily.   traMADol 50 MG tablet Commonly known as:  ULTRAM Take 1 tablet (50 mg total) by mouth every 6 (six) hours as needed.        DISCHARGE INSTRUCTIONS:   1. PCP f/u in 1-2 weeks 2. ENT f/u if worsening vertigo in 1 week  DIET:   Cardiac diet  ACTIVITY:   Activity as tolerated  OXYGEN:   Home Oxygen: No.  Oxygen Delivery: room air  DISCHARGE LOCATION:   home   If you experience worsening of your admission symptoms, develop shortness of breath, life threatening emergency, suicidal or homicidal thoughts you must seek medical attention immediately by calling 911 or calling your MD immediately  if symptoms less severe.  You Must read complete instructions/literature along with all the possible adverse reactions/side effects for all the Medicines you take and that have been prescribed to you. Take any new Medicines after you have completely understood and accpet all the possible adverse reactions/side effects.   Please note  You were cared for by a hospitalist during your hospital stay. If you have any questions about your discharge medications or the care you received while you were in the hospital after you are discharged, you can call the unit and asked to speak with the hospitalist on call if the hospitalist that took care of you is not available. Once you are discharged,  your primary care physician will handle any further medical issues. Please note that NO REFILLS for any discharge medications will be authorized once you are discharged, as it is imperative that you return to your primary care physician (or establish a relationship with a primary care physician if you do not have one) for your aftercare needs so that they can reassess your need for medications and monitor your lab values.    On  the day of Discharge:  VITAL SIGNS:   Blood pressure (!) 169/69, pulse 75, temperature 98.2 F (36.8 C), temperature source Oral, resp. rate 16, height 4\' 11"  (1.499 m), weight 54 kg (119 lb), SpO2 93 %.  PHYSICAL EXAMINATION:    GENERAL:  83 y.o.-year-old elderly patient lying in the bed with no acute distress.  EYES: Pupils equal, round, reactive to light and accommodation. No scleral icterus. Extraocular muscles intact.  HEENT: Head atraumatic, normocephalic. Oropharynx and nasopharynx clear.  NECK:  Supple, no jugular venous distention. No thyroid enlargement, no tenderness.  LUNGS: Normal breath sounds bilaterally, no wheezing, rales,rhonchi or crepitation. No use of accessory muscles of respiration.  CARDIOVASCULAR: S1, S2 normal. No   rubs, or gallops. 2/6 systolic murmur present ABDOMEN: Soft, nontender, nondistended. Bowel sounds present. No organomegaly or mass.  EXTREMITIES: No pedal edema, cyanosis, or clubbing.  NEUROLOGIC: Cranial nerves II through XII are intact. Muscle strength 5/5 in all extremities. Sensation intact. Gait not checked.  PSYCHIATRIC: The patient is alert and oriented x 3.  SKIN: No obvious rash, lesion, or ulcer.   DATA REVIEW:   CBC  Recent Labs Lab 05/21/17 1127  WBC 8.4  HGB 13.9  HCT 41.4  PLT 182    Chemistries   Recent Labs Lab 05/21/17 1127  NA 141  K 3.8  CL 102  CO2 28  GLUCOSE 160*  BUN 10  CREATININE 0.53  CALCIUM 9.5  AST 28  ALT 16  ALKPHOS 56  BILITOT 0.6     Microbiology Results  Results for orders placed or performed during the hospital encounter of 05/14/16  Blood culture (routine x 2)     Status: None   Collection Time: 05/14/16  3:50 PM  Result Value Ref Range Status   Specimen Description BLOOD RIGHT ANTECUBITAL  Final   Special Requests   Final    BOTTLES DRAWN AEROBIC AND ANAEROBIC  AER 5CC ANA 4CC   Culture NO GROWTH 5 DAYS  Final   Report Status 05/19/2016 FINAL  Final  Urine culture     Status:  Abnormal   Collection Time: 05/14/16  7:17 PM  Result Value Ref Range Status   Specimen Description URINE, CLEAN CATCH  Final   Special Requests NONE  Final   Culture MULTIPLE SPECIES PRESENT, SUGGEST RECOLLECTION (A)  Final   Report Status 05/16/2016 FINAL  Final    RADIOLOGY:  No results found.   Management plans discussed with the patient, family and they are in agreement.  CODE STATUS:     Code Status Orders        Start     Ordered   05/21/17 1625  Full code  Continuous     05/21/17 1628    Code Status History    Date Active Date Inactive Code Status Order ID Comments User Context   This patient has a current code status but no historical code status.    Advance Directive Documentation     Most Recent Value  Type of Advance Directive  Healthcare Power  of Attorney, Living will  Pre-existing out of facility DNR order (yellow form or pink MOST form)  -  "MOST" Form in Place?  -      TOTAL TIME TAKING CARE OF THIS PATIENT: 37 minutes.    Shauntea Lok M.D on 05/23/2017 at 11:35 AM  Between 7am to 6pm - Pager - [phone]  After 6pm go to [email or web address] - Social research officer, [email or web address] [email or web address]  [facility]  Office  [phone]  CC: Primary care physician; Rafael BihariWalker, John B III, MD   Note: This dictation was prepared with Dragon dictation along with smaller phrase technology. Any transcriptional errors that result from this process are unintentional.

## 2017-05-23 NOTE — Progress Notes (Signed)
Pt to be discharged to home with h.h. Iv and tele removed. disch instructions given to pt and family. prescrips given. disch via w.c. Accompanied by family

## 2017-08-23 DIAGNOSIS — E039 Hypothyroidism, unspecified: Secondary | ICD-10-CM | POA: Diagnosis present

## 2017-09-15 ENCOUNTER — Inpatient Hospital Stay
Admission: EM | Admit: 2017-09-15 | Discharge: 2017-09-19 | DRG: 193 | Disposition: A | Discharge status: Home Health | Payer: Medicare Other | Attending: Internal Medicine | Admitting: Internal Medicine

## 2017-09-15 ENCOUNTER — Emergency Department: Payer: Medicare Other

## 2017-09-15 ENCOUNTER — Encounter: Payer: Self-pay | Admitting: Emergency Medicine

## 2017-09-15 ENCOUNTER — Other Ambulatory Visit: Payer: Self-pay

## 2017-09-15 DIAGNOSIS — J9601 Acute respiratory failure with hypoxia: Secondary | ICD-10-CM | POA: Diagnosis present

## 2017-09-15 DIAGNOSIS — Z7983 Long term (current) use of bisphosphonates: Secondary | ICD-10-CM

## 2017-09-15 DIAGNOSIS — J209 Acute bronchitis, unspecified: Secondary | ICD-10-CM | POA: Diagnosis present

## 2017-09-15 DIAGNOSIS — R0602 Shortness of breath: Secondary | ICD-10-CM | POA: Diagnosis not present

## 2017-09-15 DIAGNOSIS — E785 Hyperlipidemia, unspecified: Secondary | ICD-10-CM | POA: Diagnosis present

## 2017-09-15 DIAGNOSIS — Z7951 Long term (current) use of inhaled steroids: Secondary | ICD-10-CM

## 2017-09-15 DIAGNOSIS — J101 Influenza due to other identified influenza virus with other respiratory manifestations: Principal | ICD-10-CM | POA: Diagnosis present

## 2017-09-15 DIAGNOSIS — E559 Vitamin D deficiency, unspecified: Secondary | ICD-10-CM | POA: Diagnosis present

## 2017-09-15 DIAGNOSIS — J40 Bronchitis, not specified as acute or chronic: Secondary | ICD-10-CM

## 2017-09-15 DIAGNOSIS — J189 Pneumonia, unspecified organism: Secondary | ICD-10-CM

## 2017-09-15 DIAGNOSIS — R509 Fever, unspecified: Secondary | ICD-10-CM

## 2017-09-15 DIAGNOSIS — Z79899 Other long term (current) drug therapy: Secondary | ICD-10-CM | POA: Diagnosis not present

## 2017-09-15 DIAGNOSIS — F419 Anxiety disorder, unspecified: Secondary | ICD-10-CM | POA: Diagnosis present

## 2017-09-15 DIAGNOSIS — R0902 Hypoxemia: Secondary | ICD-10-CM | POA: Diagnosis present

## 2017-09-15 DIAGNOSIS — Z7982 Long term (current) use of aspirin: Secondary | ICD-10-CM | POA: Diagnosis not present

## 2017-09-15 LAB — CBC
HEMATOCRIT: 39.3 % (ref 35.0–47.0)
HEMOGLOBIN: 12.9 g/dL (ref 12.0–16.0)
MCH: 29.8 pg (ref 26.0–34.0)
MCHC: 32.8 g/dL (ref 32.0–36.0)
MCV: 90.9 fL (ref 80.0–100.0)
Platelets: 177 10*3/uL (ref 150–440)
RBC: 4.33 MIL/uL (ref 3.80–5.20)
RDW: 13.8 % (ref 11.5–14.5)
WBC: 6.9 10*3/uL (ref 3.6–11.0)

## 2017-09-15 LAB — URINALYSIS, ROUTINE W REFLEX MICROSCOPIC
BACTERIA UA: NONE SEEN
Bilirubin Urine: NEGATIVE
Glucose, UA: NEGATIVE mg/dL
HGB URINE DIPSTICK: NEGATIVE
KETONES UR: 5 mg/dL — AB
NITRITE: NEGATIVE
Protein, ur: NEGATIVE mg/dL
SPECIFIC GRAVITY, URINE: 1.019 (ref 1.005–1.030)
Squamous Epithelial / LPF: NONE SEEN
pH: 5 (ref 5.0–8.0)

## 2017-09-15 LAB — BASIC METABOLIC PANEL
ANION GAP: 8 (ref 5–15)
BUN: 12 mg/dL (ref 6–20)
CALCIUM: 8.7 mg/dL — AB (ref 8.9–10.3)
CO2: 28 mmol/L (ref 22–32)
Chloride: 102 mmol/L (ref 101–111)
Creatinine, Ser: 0.61 mg/dL (ref 0.44–1.00)
GFR calc non Af Amer: >60 mL/min (ref >60)
GLUCOSE: 174 mg/dL — AB (ref 65–99)
POTASSIUM: 3.9 mmol/L (ref 3.5–5.1)
Sodium: 138 mmol/L (ref 135–145)

## 2017-09-15 LAB — LACTIC ACID, PLASMA: Lactic Acid, Venous: 1.1 mmol/L (ref 0.5–1.9)

## 2017-09-15 LAB — TROPONIN I

## 2017-09-15 LAB — INFLUENZA PANEL BY PCR (TYPE A & B)
Influenza A By PCR: NEGATIVE
Influenza B By PCR: NEGATIVE

## 2017-09-15 MED ORDER — LORAZEPAM 0.5 MG PO TABS
0.5000 mg | ORAL_TABLET | Freq: Two times a day (BID) | ORAL | Status: DC
  Administered 2017-09-15 – 2017-09-19 (×8): 0.5 mg via ORAL
  Filled 2017-09-15 (×8): qty 1

## 2017-09-15 MED ORDER — POLYETHYLENE GLYCOL 3350 17 G PO PACK: 17.0000 g | PACK | Freq: Every day | ORAL | Status: DC | PRN

## 2017-09-15 MED ORDER — SODIUM CHLORIDE 0.9 % IV SOLN
1.0000 g | INTRAVENOUS | Status: DC
  Administered 2017-09-16: 1 g via INTRAVENOUS
  Filled 2017-09-15: qty 10

## 2017-09-15 MED ORDER — OSELTAMIVIR PHOSPHATE 30 MG PO CAPS
30.0000 mg | ORAL_CAPSULE | Freq: Two times a day (BID) | ORAL | Status: AC
  Administered 2017-09-15 – 2017-09-17 (×5): 30 mg via ORAL
  Filled 2017-09-15 (×5): qty 1

## 2017-09-15 MED ORDER — ENOXAPARIN SODIUM 40 MG/0.4ML ~~LOC~~ SOLN
40.0000 mg | SUBCUTANEOUS | Status: DC
  Administered 2017-09-15 – 2017-09-18 (×4): 40 mg via SUBCUTANEOUS
  Filled 2017-09-15 (×4): qty 0.4

## 2017-09-15 MED ORDER — SIMVASTATIN 20 MG PO TABS
40.0000 mg | ORAL_TABLET | Freq: Every day | ORAL | Status: DC
  Administered 2017-09-15 – 2017-09-18 (×4): 40 mg via ORAL
  Filled 2017-09-15 (×4): qty 2

## 2017-09-15 MED ORDER — IPRATROPIUM-ALBUTEROL 0.5-2.5 (3) MG/3ML IN SOLN
3.0000 mL | RESPIRATORY_TRACT | Status: DC
  Administered 2017-09-16 – 2017-09-17 (×10): 3 mL via RESPIRATORY_TRACT
  Filled 2017-09-15 (×10): qty 3

## 2017-09-15 MED ORDER — ONDANSETRON HCL 4 MG PO TABS: 4.0000 mg | ORAL_TABLET | Freq: Four times a day (QID) | ORAL | Status: DC | PRN

## 2017-09-15 MED ORDER — ALBUTEROL SULFATE (2.5 MG/3ML) 0.083% IN NEBU
INHALATION_SOLUTION | RESPIRATORY_TRACT | Status: AC
  Filled 2017-09-15: qty 3

## 2017-09-15 MED ORDER — ONDANSETRON HCL 4 MG/2ML IJ SOLN: 4.0000 mg | Freq: Four times a day (QID) | INTRAMUSCULAR | Status: DC | PRN

## 2017-09-15 MED ORDER — ACETAMINOPHEN 650 MG RE SUPP: 650.0000 mg | Freq: Four times a day (QID) | RECTAL | Status: DC | PRN

## 2017-09-15 MED ORDER — ACETAMINOPHEN 325 MG PO TABS
650.0000 mg | ORAL_TABLET | Freq: Four times a day (QID) | ORAL | Status: DC | PRN
  Administered 2017-09-17: 650 mg via ORAL
  Filled 2017-09-15: qty 2

## 2017-09-15 MED ORDER — METHYLPREDNISOLONE SODIUM SUCC 125 MG IJ SOLR
125.0000 mg | Freq: Once | INTRAMUSCULAR | Status: AC
  Administered 2017-09-15: 125 mg via INTRAVENOUS
  Filled 2017-09-15: qty 2

## 2017-09-15 MED ORDER — ALBUTEROL SULFATE (2.5 MG/3ML) 0.083% IN NEBU
2.5000 mg | INHALATION_SOLUTION | Freq: Once | RESPIRATORY_TRACT | Status: AC
  Administered 2017-09-15: 2.5 mg via RESPIRATORY_TRACT

## 2017-09-15 MED ORDER — SODIUM CHLORIDE 0.9 % IV SOLN
500.0000 mg | INTRAVENOUS | Status: DC
  Administered 2017-09-15: 500 mg via INTRAVENOUS

## 2017-09-15 MED ORDER — SODIUM CHLORIDE 0.9 % IV SOLN
INTRAVENOUS | Status: DC
  Administered 2017-09-15: 23:00:00 via INTRAVENOUS

## 2017-09-15 MED ORDER — SODIUM CHLORIDE 0.9 % IV SOLN
INTRAVENOUS | Status: AC
  Filled 2017-09-15: qty 500

## 2017-09-15 NOTE — Progress Notes (Signed)
ANTIBIOTIC CONSULT NOTE - INITIAL  Pharmacy Consult for Ceftriaxone  Indication: pneumonia  No Known Allergies  Patient Measurements: Height: 4\' 11"  (149.9 cm) Weight: 127 lb (57.6 kg) IBW/kg (Calculated) : 43.2 Adjusted Body Weight:   Vital Signs: Temp: 100.6 F (38.1 C) (02/13 1906) Temp Source: Oral (02/13 1906) BP: 138/60 (02/13 1906) Pulse Rate: 108 (02/13 1906) Intake/Output from previous day: No intake/output data recorded. Intake/Output from this shift: No intake/output data recorded.  Labs: Recent Labs    09/15/17 1912  WBC 6.9  HGB 12.9  PLT 177  CREATININE 0.61   Estimated Creatinine Clearance: 36.2 mL/min (by C-G formula based on SCr of 0.61 mg/dL). No results for input(s): VANCOTROUGH, VANCOPEAK, VANCORANDOM, GENTTROUGH, GENTPEAK, GENTRANDOM, TOBRATROUGH, TOBRAPEAK, TOBRARND, AMIKACINPEAK, AMIKACINTROU, AMIKACIN in the last 72 hours.   Microbiology: No results found for this or any previous visit (from the past 720 hour(s)).  Medical History: History reviewed. No pertinent past medical history.  Medications:  Scheduled:  . enoxaparin (LOVENOX) injection  40 mg Subcutaneous Q24H  . ipratropium-albuterol  3 mL Nebulization Q4H   Assessment:   Goal of Therapy:  resolution of infection  Plan:  Expected duration 7 days with resolution of temperature and/or normalization of WBC   Will start ceftriaxone 1 gm IV Q24H on 2/13 @ 21:00.   Leaira Fullam D 09/15/2017,8:34 PM

## 2017-09-15 NOTE — H&P (Signed)
[facility] -  at [facility]   PATIENT NAME: Margaret Stephens    MR#:  [id number]  DATE OF BIRTH:  April 22, 1925  DATE OF ADMISSION:  09/15/2017  PRIMARY CARE PHYSICIAN: Rafael Bihari, MD   REQUESTING/REFERRING PHYSICIAN: dr Langston Masker  CHIEF COMPLAINT:   Sob and wheezing HISTORY OF PRESENT ILLNESS:  Margaret Stephens  is a 84 y.o. female with a known history of anxiety and vitamin D deficiency who presents today to the emergency room with shortness of breath and wheezing. Patient reports that she was treated on Sunday for influenza on Tamiflu and Tessalon Perles. She says that she has had a fever, cough and shortness of breath. Her oxygen saturation was 67% upon arrival. She is currently saturating 94 percent on 2 L nasal cannula.  PAST MEDICAL HISTORY:  Anxiety Vitamin D deficiency  PAST SURGICAL HISTORY:   Past Surgical History:  Procedure Laterality Date  . KNEE ARTHROSCOPY Left   . RECTAL SURGERY      SOCIAL HISTORY:   Social History   Tobacco Use  . Smoking status: Never Smoker  . Smokeless tobacco: Never Used  Substance Use Topics  . Alcohol use: No    FAMILY HISTORY:  No family history on file.  DRUG ALLERGIES:  No Known Allergies  REVIEW OF SYSTEMS:   Review of Systems  Constitutional: Positive for fever. Negative for chills and malaise/fatigue.  HENT: Negative.  Negative for ear discharge, ear pain, hearing loss, nosebleeds and sore throat.   Eyes: Negative.  Negative for blurred vision and pain.  Respiratory: Positive for cough and shortness of breath. Negative for hemoptysis and wheezing.   Cardiovascular: Negative.  Negative for chest pain, palpitations and leg swelling.  Gastrointestinal: Negative.  Negative for abdominal pain, blood in stool, diarrhea, nausea and vomiting.  Genitourinary: Negative.  Negative for dysuria.  Musculoskeletal: Negative.  Negative for back pain.  Skin: Negative.   Neurological: Negative for dizziness,  tremors, speech change, focal weakness, seizures and headaches.  Endo/Heme/Allergies: Negative.  Does not bruise/bleed easily.  Psychiatric/Behavioral: Negative.  Negative for depression, hallucinations and suicidal ideas.    MEDICATIONS AT HOME:   Prior to Admission medications   Medication Sig Start Date End Date Taking? Authorizing Provider  alendronate (FOSAMAX) 70 MG tablet Take 70 mg by mouth once a week. Take with a full glass of water on an empty stomach.   Yes [provider]  aspirin EC 81 MG tablet Take 81 mg by mouth daily.   Yes [provider]  benzonatate (TESSALON) 200 MG capsule Take 1 capsule by mouth 3 (three) times daily. 09/12/17  Yes [provider]  fluticasone (FLONASE) 50 MCG/ACT nasal spray Place 1 spray into both nostrils 2 (two) times daily. 09/12/17  Yes [provider]  LORazepam (ATIVAN) 0.5 MG tablet Take 0.5 mg by mouth 2 (two) times daily.    Yes [provider]  meclizine (ANTIVERT) 12.5 MG tablet Take 1 tablet (12.5 mg total) by mouth 3 (three) times daily. 05/23/17  Yes Enid Baas, MD  oseltamivir (TAMIFLU) 75 MG capsule Take 75 mg by mouth 2 (two) times daily. 09/12/17  Yes [provider]  simvastatin (ZOCOR) 40 MG tablet Take 40 mg by mouth daily.   Yes [provider]  traMADol (ULTRAM) 50 MG tablet Take 1 tablet (50 mg total) by mouth every 6 (six) hours as needed. 12/19/16   Triplett, Kasandra Knudsen, FNP      VITAL SIGNS:  Blood  pressure 138/60, pulse (!) 108, temperature (!) 100.6 F (38.1 C), temperature source Oral, resp. rate 20, height 4\' 11"  (1.499 m), weight 57.6 kg (127 lb), SpO2 (!) 88 %.  PHYSICAL EXAMINATION:   Physical Exam  Constitutional: She is oriented to person, place, and time and well-developed, well-nourished, and in no distress. No distress.  HENT:  Head: Normocephalic.  Eyes: No scleral icterus.  Neck: Normal range of motion. Neck supple. No JVD present. No  tracheal deviation present.  Cardiovascular: Normal rate, regular rhythm and normal heart sounds. Exam reveals no gallop and no friction rub.  No murmur heard. Pulmonary/Chest: Effort normal. No respiratory distress. She has wheezes. She has no rales. She exhibits no tenderness.  Abdominal: Soft. Bowel sounds are normal. She exhibits no distension and no mass. There is no tenderness. There is no rebound and no guarding.  Musculoskeletal: Normal range of motion. She exhibits no edema.  Neurological: She is alert and oriented to person, place, and time.  Skin: Skin is warm. No rash noted. No erythema.  Psychiatric: Affect and judgment normal.      LABORATORY PANEL:   CBC Recent Labs  Lab 09/15/17 1912  WBC 6.9  HGB 12.9  HCT 39.3  PLT 177   ------------------------------------------------------------------------------------------------------------------  Chemistries  Recent Labs  Lab 09/15/17 1912  NA 138  K 3.9  CL 102  CO2 28  GLUCOSE 174*  BUN 12  CREATININE 0.61  CALCIUM 8.7*   ------------------------------------------------------------------------------------------------------------------  Cardiac Enzymes Recent Labs  Lab 09/15/17 1912  TROPONINI <0.03   ------------------------------------------------------------------------------------------------------------------  RADIOLOGY:  Dg Chest 2 View  Result Date: 09/15/2017 CLINICAL DATA:  Worsening cough and dyspnea. EXAM: CHEST  2 VIEW COMPARISON:  05/14/2016 FINDINGS: Heart size is stable and within normal limits. Tortuous thoracic aorta atherosclerosis. No aneurysm. There is a superimposed large hiatal hernia similar in appearance to prior with air-fluid level noted within. Hyperinflated lungs with chronic interstitial prominence. No pneumonic consolidation effusion or pneumothorax. Chronic stable midthoracic kyphosis degenerative disc disease and endplate spurring. IMPRESSION: Large hiatal hernia. Tortuous  atherosclerotic aorta without aneurysm. Mild pulmonary hyperinflation. No active pulmonary disease. Electronically Signed   By: Tollie Ethavid  Kwon M.D.   On: 09/15/2017 19:59    EKG:  Sinus tachycardia with nonspecific ST changes in inferior leads  IMPRESSION AND PLAN:   84 year old female with history of anxiety who was recently treated for influenza presents with fever, shortness of breath and wheezing.  1. Acute hypoxic respiratory failure: Chest x-ray does not show evidence of pneumonia. Wean oxygen to room air as tolerated I will treat as this patient may have underlying pneumonia with azithromycin and Rocephin. We can repeat chest x-ray in a.m. and if this is negative for pneumonia than patient should be treated for acute bronchitis with azithromycin. She does have wheezing which may be part of acute bronchitis/pneumonia. I will start duo nebs and hold off on steroids at this point. If her wheezing is worse then she will need a course of steroids.  2. Sepsis: Patient presents with fever and tachycardia Sepsis may be due to a pneumonia not yet seen on x-ray  3. Anxiety: Continue Ativan  4. Recent influenza A: Continue Tamiflu. She started this on Sunday  5. Hyperlipidemia: Continue statin  Physical therapy consultation for discharge planning     All the records are reviewed and case discussed with ED provider. Management plans discussed with the patient and family and they are in agreement  CODE STATUS: Full  TOTAL TIME TAKING  CARE OF THIS PATIENT: 40 minutes.    Shakim Faith M.D on 09/15/2017 at 8:44 PM  Between 7am to 6pm - Pager - [phone]  After 6pm go to [email or web address] - password [email or web address]  [facility] [place] [facility]  Office  [phone]  CC: Primary care physician; Rafael Bihari, MD

## 2017-09-15 NOTE — ED Provider Notes (Signed)
[facility] Emergency Department Provider Note  ____________________________________________   First MD Initiated Contact with Patient 09/15/17 1957     (approximate)  I have reviewed the triage vital signs and the nursing notes.   HISTORY  Chief Complaint Shortness of Breath   HPI Margaret Stephens is a 84 y.o. female who is presenting to the emergency department today with worsening shortness of breath as well as fever.  She was diagnosed this past Sunday with influenza although she had a negative flu swab at her primary care doctor's office.  Because of her symptoms she was started on Tamiflu.  However, her symptoms have worsened with worsening shortness of breath and cough.  Patient says that she has mild pain to her throat.  Denies any chest pain.  Says that she was in the 60s on her oxygen saturations at the urgent care and was sent to the emergency department for further evaluation and treatment.   History reviewed. No pertinent past medical history.  Patient Active Problem List   Diagnosis Date Noted  . Hypoxia 09/15/2017  . Dizziness 05/21/2017    Past Surgical History:  Procedure Laterality Date  . KNEE ARTHROSCOPY Left   . RECTAL SURGERY      Prior to Admission medications   Medication Sig Start Date End Date Taking? Authorizing Provider  alendronate (FOSAMAX) 70 MG tablet Take 70 mg by mouth once a week. Take with a full glass of water on an empty stomach.   Yes [provider]  aspirin EC 81 MG tablet Take 81 mg by mouth daily.   Yes [provider]  benzonatate (TESSALON) 200 MG capsule Take 1 capsule by mouth 3 (three) times daily. 09/12/17  Yes [provider]  fluticasone (FLONASE) 50 MCG/ACT nasal spray Place 1 spray into both nostrils 2 (two) times daily. 09/12/17  Yes [provider]  LORazepam (ATIVAN) 0.5 MG tablet Take 0.5 mg by mouth 2 (two) times daily.    Yes [provider]  meclizine  (ANTIVERT) 12.5 MG tablet Take 1 tablet (12.5 mg total) by mouth 3 (three) times daily. 05/23/17  Yes Enid BaasKalisetti, Radhika, MD  oseltamivir (TAMIFLU) 75 MG capsule Take 75 mg by mouth 2 (two) times daily. 09/12/17  Yes [provider]  simvastatin (ZOCOR) 40 MG tablet Take 40 mg by mouth daily.   Yes [provider]  traMADol (ULTRAM) 50 MG tablet Take 1 tablet (50 mg total) by mouth every 6 (six) hours as needed. 12/19/16   Chinita Pesterriplett, Cari B, FNP    Allergies Patient has no known allergies.  No family history on file.  Social History Social History   Tobacco Use  . Smoking status: Never Smoker  . Smokeless tobacco: Never Used  Substance Use Topics  . Alcohol use: No  . Drug use: No    Review of Systems  Constitutional: Fever Eyes: No visual changes. ENT: As above Cardiovascular: Denies chest pain. Respiratory: As above Gastrointestinal: No abdominal pain.  No nausea, no vomiting.  No diarrhea.  No constipation. Genitourinary: Negative for dysuria. Musculoskeletal: Negative for back pain. Skin: Negative for rash. Neurological: Negative for headaches, focal weakness or numbness.   ____________________________________________   PHYSICAL EXAM:  VITAL SIGNS: ED Triage Vitals  Enc Vitals Group     BP 09/15/17 1906 138/60     Pulse Rate 09/15/17 1906 (!) 108     Resp 09/15/17 1906 20     Temp 09/15/17 1906 (!) 100.6 F (38.1  C)     Temp Source 09/15/17 1906 Oral     SpO2 09/15/17 1906 (!) 88 %     Weight 09/15/17 1907 127 lb (57.6 kg)     Height 09/15/17 1907 4\' 11"  (1.499 m)     Head Circumference --      Peak Flow --      Pain Score 09/15/17 1907 5     Pain Loc --      Pain Edu? --      Excl. in GC? --     Constitutional: Alert and oriented. Well appearing and in no acute distress. Eyes: Conjunctivae are normal.  Head: Atraumatic. Nose: No congestion/rhinnorhea.  Wearing nasal cannula oxygen. Mouth/Throat: Mucous membranes are moist.  Neck:  No stridor.   Cardiovascular: Normal rate, regular rhythm. Grossly normal heart sounds.  Respiratory: Tachypneic with prolonged expiratory phase.  Wheezing throughout all fields.  Able to speak in full sentences. Gastrointestinal: Soft and nontender. No distention. No CVA tenderness. Musculoskeletal: No lower extremity tenderness nor edema.  No joint effusions. Neurologic:  Normal speech and language. No gross focal neurologic deficits are appreciated. Skin:  Skin is warm, dry and intact. No rash noted. Psychiatric: Mood and affect are normal. Speech and behavior are normal.  ____________________________________________   LABS (all labs ordered are listed, but only abnormal results are displayed)  Labs Reviewed  BASIC METABOLIC PANEL - Abnormal; Notable for the following components:      Result Value   Glucose, Bld 174 (*)    Calcium 8.7 (*)    All other components within normal limits  CBC  TROPONIN I  INFLUENZA PANEL BY PCR (TYPE A & B)  CBC  CREATININE, SERUM  BASIC METABOLIC PANEL  CBC   ____________________________________________  EKG  ED ECG REPORT I, Arelia Longest, the attending physician, personally viewed and interpreted this ECG.   Date: 09/15/2017  EKG Time: 1910  Rate: 106  Rhythm: sinus tachycardia  Axis: Normal  Intervals:none  ST&T Change: No ST segment elevation or depression.  No abnormal T wave inversion. ____________________________________________  RADIOLOGY  No consolidation seen on the chest x-ray.  Large hiatal hernia present. ____________________________________________   PROCEDURES  Procedure(s) performed:   Procedures  Critical Care performed:   ____________________________________________   INITIAL IMPRESSION / ASSESSMENT AND PLAN / ED COURSE  Pertinent labs & imaging results that were available during my care of the patient were reviewed by me and considered in my medical decision making (see chart for  details).  Differential includes, but is not limited to, viral syndrome, bronchitis including COPD exacerbation, pneumonia, reactive airway disease including asthma, CHF including exacerbation with or without pulmonary/interstitial edema, pneumothorax, ACS, thoracic trauma, and pulmonary embolism. As part of my medical decision making, I reviewed the following data within the electronic MEDICAL RECORD NUMBER Notes from prior ED visits  ----------------------------------------- 8:49 PM on 09/15/2017 -----------------------------------------  Patient hypoxic with wheezing.  Likely bronchitis.  Will be admitted to the hospital.  Will be treated with duo nebs as well as steroids and antibiotics.  Signed out to Dr. Tildon Husky.  Patient is understanding of the plan willing to comply.      ____________________________________________   FINAL CLINICAL IMPRESSION(S) / ED DIAGNOSES  Bronchitis.  Hypoxia.    NEW MEDICATIONS STARTED DURING THIS VISIT:  New Prescriptions   No medications on file     Note:  This document was prepared using Dragon voice recognition software and may include unintentional dictation errors.  Myrna Blazer, MD 09/15/17 2049

## 2017-09-15 NOTE — ED Notes (Addendum)
Placed on 2L [place] for sat of 88% on RA. Patient now sating 97% on 2L [place].

## 2017-09-15 NOTE — ED Notes (Signed)
Rebecca RN, aware of bed assigned 

## 2017-09-15 NOTE — ED Triage Notes (Signed)
Patient was treated on Sunday for influenza. Was given Tamiflu and Tessalon, but cough has gotten worse. Today had increased fever, was seen at urgent care and had sat of 67% upon arrival, staff were able to get end result of 87%. Patient does not have h/o COPD or oxygen use.

## 2017-09-15 NOTE — Progress Notes (Signed)
Family Meeting Note  Advance Directive:yes  Today a meeting took place with the Patient. And family at bedside  The following clinical team members were present during this meeting:MD  The following were discussed:Patient's diagnosis acute hypoxic respiratory failure with wheezing: , Patient's progosis: Unable to determine and Goals for treatment: Full Code  Additional follow-up to be provided: Patient would like to remain full code. Advanced directives are up to date.  Time spent during discussion: 16 minutes  Antwann Preziosi, MD

## 2017-09-16 ENCOUNTER — Encounter: Payer: Self-pay | Admitting: Internal Medicine

## 2017-09-16 ENCOUNTER — Inpatient Hospital Stay: Payer: Medicare Other

## 2017-09-16 LAB — BLOOD CULTURE ID PANEL (REFLEXED)
Acinetobacter baumannii: NOT DETECTED
CANDIDA TROPICALIS: NOT DETECTED
Candida albicans: NOT DETECTED
Candida glabrata: NOT DETECTED
Candida krusei: NOT DETECTED
Candida parapsilosis: NOT DETECTED
Enterobacter cloacae complex: NOT DETECTED
Enterobacteriaceae species: NOT DETECTED
Enterococcus species: NOT DETECTED
Escherichia coli: NOT DETECTED
Haemophilus influenzae: NOT DETECTED
KLEBSIELLA PNEUMONIAE: NOT DETECTED
Klebsiella oxytoca: NOT DETECTED
Listeria monocytogenes: NOT DETECTED
METHICILLIN RESISTANCE: NOT DETECTED
NEISSERIA MENINGITIDIS: NOT DETECTED
PROTEUS SPECIES: NOT DETECTED
Pseudomonas aeruginosa: NOT DETECTED
SERRATIA MARCESCENS: NOT DETECTED
STAPHYLOCOCCUS AUREUS BCID: NOT DETECTED
STAPHYLOCOCCUS SPECIES: DETECTED — AB
STREPTOCOCCUS SPECIES: NOT DETECTED
Streptococcus agalactiae: NOT DETECTED
Streptococcus pneumoniae: NOT DETECTED
Streptococcus pyogenes: NOT DETECTED

## 2017-09-16 LAB — BASIC METABOLIC PANEL
Anion gap: 9 (ref 5–15)
BUN: 11 mg/dL (ref 6–20)
CALCIUM: 8.5 mg/dL — AB (ref 8.9–10.3)
CHLORIDE: 104 mmol/L (ref 101–111)
CO2: 27 mmol/L (ref 22–32)
CREATININE: 0.49 mg/dL (ref 0.44–1.00)
GFR calc Af Amer: >60 mL/min (ref >60)
GFR calc non Af Amer: >60 mL/min (ref >60)
Glucose, Bld: 156 mg/dL — ABNORMAL HIGH (ref 65–99)
Potassium: 4.2 mmol/L (ref 3.5–5.1)
SODIUM: 140 mmol/L (ref 135–145)

## 2017-09-16 LAB — CBC
HCT: 36.2 % (ref 35.0–47.0)
Hemoglobin: 12.2 g/dL (ref 12.0–16.0)
MCH: 30.2 pg (ref 26.0–34.0)
MCHC: 33.6 g/dL (ref 32.0–36.0)
MCV: 90 fL (ref 80.0–100.0)
PLATELETS: 166 10*3/uL (ref 150–440)
RBC: 4.02 MIL/uL (ref 3.80–5.20)
RDW: 13.4 % (ref 11.5–14.5)
WBC: 6.7 10*3/uL (ref 3.6–11.0)

## 2017-09-16 LAB — LACTIC ACID, PLASMA: Lactic Acid, Venous: 0.6 mmol/L (ref 0.5–1.9)

## 2017-09-16 MED ORDER — AZITHROMYCIN 500 MG PO TABS
500.0000 mg | ORAL_TABLET | Freq: Every day | ORAL | Status: AC
  Administered 2017-09-16 – 2017-09-18 (×3): 500 mg via ORAL
  Filled 2017-09-16 (×3): qty 1

## 2017-09-16 MED ORDER — ASPIRIN EC 81 MG PO TBEC
81.0000 mg | DELAYED_RELEASE_TABLET | Freq: Every day | ORAL | Status: DC
  Administered 2017-09-16 – 2017-09-19 (×4): 81 mg via ORAL
  Filled 2017-09-16 (×4): qty 1

## 2017-09-16 NOTE — Progress Notes (Signed)
[facility] - [place] at [facility]   PATIENT NAME: Margaret Stephens    MR#:  [id number]  DATE OF BIRTH:  [birth year]  SUBJECTIVE:  CHIEF COMPLAINT:   Chief Complaint  Patient presents with  . Shortness of Breath   Continues to need 2 L oxygen.  Afebrile today.  Still has shortness of breath and congestion in her chest.  REVIEW OF SYSTEMS:    Review of Systems  Constitutional: Positive for malaise/fatigue. Negative for chills and fever.  HENT: Negative for sore throat.   Eyes: Negative for blurred vision, double vision and pain.  Respiratory: Positive for cough, shortness of breath and wheezing. Negative for hemoptysis.   Cardiovascular: Negative for chest pain, palpitations, orthopnea and leg swelling.  Gastrointestinal: Negative for abdominal pain, constipation, diarrhea, heartburn, nausea and vomiting.  Genitourinary: Negative for dysuria and hematuria.  Musculoskeletal: Negative for back pain and joint pain.  Skin: Negative for rash.  Neurological: Positive for weakness. Negative for sensory change, speech change, focal weakness and headaches.  Endo/Heme/Allergies: Does not bruise/bleed easily.  Psychiatric/Behavioral: Negative for depression. The patient is not nervous/anxious.     DRUG ALLERGIES:  No Known Allergies  VITALS:  Blood pressure 122/64, pulse (!) 101, temperature 98.2 F (36.8 C), temperature source Oral, resp. rate 16, height 4\' 11"  (1.499 m), weight 57.6 kg (127 lb), SpO2 94 %.  PHYSICAL EXAMINATION:   Physical Exam  GENERAL:  84 y.o.-year-old patient lying in the bed with no acute distress.  EYES: Pupils equal, round, reactive to light and accommodation. No scleral icterus. Extraocular muscles intact.  HEENT: Head atraumatic, normocephalic. Oropharynx and nasopharynx clear.  NECK:  Supple, no jugular venous distention. No thyroid enlargement, no tenderness.  LUNGS: Bilateral wheezing and decreased air entry CARDIOVASCULAR: S1, S2 normal.  No murmurs, rubs, or gallops.  ABDOMEN: Soft, nontender, nondistended. Bowel sounds present. No organomegaly or mass.  EXTREMITIES: No cyanosis, clubbing or edema b/l.    NEUROLOGIC: Cranial nerves II through XII are intact. No focal Motor or sensory deficits b/l.   PSYCHIATRIC: The patient is alert and oriented x 3.  SKIN: No obvious rash, lesion, or ulcer.   LABORATORY PANEL:   CBC Recent Labs  Lab 09/16/17 0033  WBC 6.7  HGB 12.2  HCT 36.2  PLT 166   ------------------------------------------------------------------------------------------------------------------ Chemistries  Recent Labs  Lab 09/16/17 0033  NA 140  K 4.2  CL 104  CO2 27  GLUCOSE 156*  BUN 11  CREATININE 0.49  CALCIUM 8.5*   ------------------------------------------------------------------------------------------------------------------  Cardiac Enzymes Recent Labs  Lab 09/15/17 1912  TROPONINI <0.03   ------------------------------------------------------------------------------------------------------------------  RADIOLOGY:  Dg Chest 1 View  Result Date: 09/16/2017 CLINICAL DATA:  Pneumonia. EXAM: CHEST 1 VIEW COMPARISON:  Chest radiograph September 15, 2017 FINDINGS: Cardiac silhouette is mildly enlarged and unchanged. Large hiatal hernia. Calcified aortic knob. Diffuse interstitial prominence unchanged. No pleural effusion. No pneumothorax. Soft tissue planes and included osseous structures are unchanged. Osteopenia. IMPRESSION: Chronic interstitial changes.  Stable cardiomegaly. Large hiatal hernia. Electronically Signed   By: Awilda Metroourtnay  Bloomer M.D.   On: 09/16/2017 04:55   Dg Chest 2 View  Result Date: 09/15/2017 CLINICAL DATA:  Worsening cough and dyspnea. EXAM: CHEST  2 VIEW COMPARISON:  05/14/2016 FINDINGS: Heart size is stable and within normal limits. Tortuous thoracic aorta atherosclerosis. No aneurysm. There is a superimposed large hiatal hernia similar in appearance to prior with  air-fluid level noted within. Hyperinflated lungs with chronic interstitial prominence. No pneumonic consolidation effusion or pneumothorax.  Chronic stable midthoracic kyphosis degenerative disc disease and endplate spurring. IMPRESSION: Large hiatal hernia. Tortuous atherosclerotic aorta without aneurysm. Mild pulmonary hyperinflation. No active pulmonary disease. Electronically Signed   By: Tollie Eth M.D.   On: 09/15/2017 19:59     ASSESSMENT AND PLAN:   *Acute hypoxic respiratory failure secondary to acute bronchitis.  Recent influenza.  Patient will finish her Tamiflu tomorrow. On azithromycin, steroids, nebulizers. Wean oxygen as tolerated.  Today oxygen levels were 83% on room air.  All the records are reviewed and case discussed with Care Management/Social Worker Management plans discussed with the patient, family and they are in agreement.  CODE STATUS: FULL CODE  DVT Prophylaxis: SCDs  TOTAL TIME TAKING CARE OF THIS PATIENT: 35 minutes.   POSSIBLE D/C IN 1-2 DAYS, DEPENDING ON CLINICAL CONDITION.  Orie Fisherman M.D on 09/16/2017 at 3:38 PM  Between 7am to 6pm - Pager - [phone]  After 6pm go to [email or web address] - password [email or web address]  [facility] [place] [facility]  Office  [phone]  CC: Primary care physician; Danella Penton, MD  Note: This dictation was prepared with Dragon dictation along with smaller phrase technology. Any transcriptional errors that result from this process are unintentional.

## 2017-09-16 NOTE — Plan of Care (Signed)
  Progressing Activity: Ability to tolerate increased activity will improve 09/16/2017 1059 - Progressing by Kathreen CosierMalcolm, Calahan Pak A, RN Clinical Measurements: Ability to maintain a body temperature in the normal range will improve 09/16/2017 1059 - Progressing by Kathreen CosierMalcolm, Croy Drumwright A, RN Respiratory: Ability to maintain adequate ventilation will improve 09/16/2017 1059 - Progressing by Kathreen CosierMalcolm, Jessabelle Markiewicz A, RN Ability to maintain a clear airway will improve 09/16/2017 1059 - Progressing by Kathreen CosierMalcolm, Diogenes Whirley A, RN Education: Knowledge of General Education information will improve 09/16/2017 1059 - Progressing by Kathreen CosierMalcolm, Yanni Quiroa A, RN Clinical Measurements: Will remain free from infection 09/16/2017 1059 - Progressing by Kathreen CosierMalcolm, Jawan Chavarria A, RN Activity: Risk for activity intolerance will decrease 09/16/2017 1059 - Progressing by Kathreen CosierMalcolm, Shlok Raz A, RN Pain Managment: General experience of comfort will improve 09/16/2017 1059 - Progressing by Kathreen CosierMalcolm, Agnes Probert A, RN Safety: Ability to remain free from injury will improve 09/16/2017 1059 - Progressing by Kathreen CosierMalcolm, Demetris Meinhardt A, RN Skin Integrity: Risk for impaired skin integrity will decrease 09/16/2017 1059 - Progressing by Kathreen CosierMalcolm, Kenndra Morris A, RN

## 2017-09-16 NOTE — Progress Notes (Signed)
PHARMACY - PHYSICIAN COMMUNICATION CRITICAL VALUE ALERT - BLOOD CULTURE IDENTIFICATION (BCID)  No results found for this or any previous visit.  Name of physician (or Provider) Contacted: Sainani   Changes to prescribed antibiotics required: No , probably contaminant, wait and see what grows.   Jamelle Noy D 09/16/2017  6:25 PM

## 2017-09-16 NOTE — Care Management (Signed)
Patient admitted from home with respiratory failure.  Patient lives at home alone.  PCP Hyacinth MeekerMiller.  Patient has RW, BSC, and cane in the home.  [facility].  Oxygen needs are acute at this time.  Per bedside nurse a PT consult is not indicated as patient has been steady/ambulatory in the room.  RNCM following for any needs.

## 2017-09-17 LAB — URINE CULTURE: CULTURE: NO GROWTH

## 2017-09-17 MED ORDER — IPRATROPIUM-ALBUTEROL 0.5-2.5 (3) MG/3ML IN SOLN: 3.0000 mL | RESPIRATORY_TRACT | Status: DC | PRN

## 2017-09-17 NOTE — Care Management Important Message (Signed)
Important Message  Patient Details  Name: Margaret Stephens MRN: [id number] Date of Birth: 01-May-1925   Medicare Important Message Given:  Yes    Chapman FitchBOWEN, Vyom Brass T, RN 09/17/2017, 11:28 AM

## 2017-09-17 NOTE — Progress Notes (Signed)
[facility] - [place] at [facility]   PATIENT NAME: Margaret Stephens    MR#:  [id number]  DATE OF BIRTH:  19-Dec-1924  SUBJECTIVE:  CHIEF COMPLAINT:   Chief Complaint  Patient presents with  . Shortness of Breath   Shortness of breath is improved.  Coughing but no sputum.  Afebrile.  Son at bedside.  REVIEW OF SYSTEMS:    Review of Systems  Constitutional: Positive for malaise/fatigue. Negative for chills and fever.  HENT: Negative for sore throat.   Eyes: Negative for blurred vision, double vision and pain.  Respiratory: Positive for cough, shortness of breath and wheezing. Negative for hemoptysis.   Cardiovascular: Negative for chest pain, palpitations, orthopnea and leg swelling.  Gastrointestinal: Negative for abdominal pain, constipation, diarrhea, heartburn, nausea and vomiting.  Genitourinary: Negative for dysuria and hematuria.  Musculoskeletal: Negative for back pain and joint pain.  Skin: Negative for rash.  Neurological: Positive for weakness. Negative for sensory change, speech change, focal weakness and headaches.  Endo/Heme/Allergies: Does not bruise/bleed easily.  Psychiatric/Behavioral: Negative for depression. The patient is not nervous/anxious.     DRUG ALLERGIES:  No Known Allergies  VITALS:  Blood pressure 126/65, pulse (!) 109, temperature 98.3 F (36.8 C), temperature source Oral, resp. rate 18, height 4\' 11"  (1.499 m), weight 57.6 kg (127 lb), SpO2 95 %.  PHYSICAL EXAMINATION:   Physical Exam  GENERAL:  84 y.o.-year-old patient lying in the bed with no acute distress.  EYES: Pupils equal, round, reactive to light and accommodation. No scleral icterus. Extraocular muscles intact.  HEENT: Head atraumatic, normocephalic. Oropharynx and nasopharynx clear.  NECK:  Supple, no jugular venous distention. No thyroid enlargement, no tenderness.  LUNGS: Bilateral wheezing and decreased air entry CARDIOVASCULAR: S1, S2 normal. No murmurs, rubs, or  gallops.  ABDOMEN: Soft, nontender, nondistended. Bowel sounds present. No organomegaly or mass.  EXTREMITIES: No cyanosis, clubbing or edema b/l.    NEUROLOGIC: Cranial nerves II through XII are intact. No focal Motor or sensory deficits b/l.   PSYCHIATRIC: The patient is alert and oriented x 3.  SKIN: No obvious rash, lesion, or ulcer.   LABORATORY PANEL:   CBC Recent Labs  Lab 09/16/17 0033  WBC 6.7  HGB 12.2  HCT 36.2  PLT 166   ------------------------------------------------------------------------------------------------------------------ Chemistries  Recent Labs  Lab 09/16/17 0033  NA 140  K 4.2  CL 104  CO2 27  GLUCOSE 156*  BUN 11  CREATININE 0.49  CALCIUM 8.5*   ------------------------------------------------------------------------------------------------------------------  Cardiac Enzymes Recent Labs  Lab 09/15/17 1912  TROPONINI <0.03   ------------------------------------------------------------------------------------------------------------------  RADIOLOGY:  Dg Chest 1 View  Result Date: 09/16/2017 CLINICAL DATA:  Pneumonia. EXAM: CHEST 1 VIEW COMPARISON:  Chest radiograph September 15, 2017 FINDINGS: Cardiac silhouette is mildly enlarged and unchanged. Large hiatal hernia. Calcified aortic knob. Diffuse interstitial prominence unchanged. No pleural effusion. No pneumothorax. Soft tissue planes and included osseous structures are unchanged. Osteopenia. IMPRESSION: Chronic interstitial changes.  Stable cardiomegaly. Large hiatal hernia. Electronically Signed   By: Awilda Metroourtnay  Bloomer M.D.   On: 09/16/2017 04:55   Dg Chest 2 View  Result Date: 09/15/2017 CLINICAL DATA:  Worsening cough and dyspnea. EXAM: CHEST  2 VIEW COMPARISON:  05/14/2016 FINDINGS: Heart size is stable and within normal limits. Tortuous thoracic aorta atherosclerosis. No aneurysm. There is a superimposed large hiatal hernia similar in appearance to prior with air-fluid level noted  within. Hyperinflated lungs with chronic interstitial prominence. No pneumonic consolidation effusion or pneumothorax. Chronic stable midthoracic kyphosis  degenerative disc disease and endplate spurring. IMPRESSION: Large hiatal hernia. Tortuous atherosclerotic aorta without aneurysm. Mild pulmonary hyperinflation. No active pulmonary disease. Electronically Signed   By: Tollie Eth M.D.   On: 09/15/2017 19:59     ASSESSMENT AND PLAN:   *Acute hypoxic respiratory failure secondary to acute bronchitis.  Recent influenza.  Patient will finish her 5 days of Tamiflu today. On azithromycin, steroids, nebulizers. Wean oxygen as tolerated.  Patient saturations drop below 90% on ambulation.  Continue oxygen.  Likely discharge home tomorrow.  All the records are reviewed and case discussed with Care Management/Social Worker Management plans discussed with the patient, family and they are in agreement.  CODE STATUS: FULL CODE  DVT Prophylaxis: SCDs  TOTAL TIME TAKING CARE OF THIS PATIENT: 35 minutes.   POSSIBLE D/C IN 1-2 DAYS, DEPENDING ON CLINICAL CONDITION.  Molinda Bailiff Stepan Verrette M.D on 09/17/2017 at 1:37 PM  Between 7am to 6pm - Pager - [phone]  After 6pm go to [email or web address] - password [email or web address]  [facility] [place] [facility]  Office  [phone]  CC: Primary care physician; Danella Penton, MD  Note: This dictation was prepared with Dragon dictation along with smaller phrase technology. Any transcriptional errors that result from this process are unintentional.

## 2017-09-17 NOTE — Plan of Care (Signed)
  Progressing Activity: Ability to tolerate increased activity will improve 09/17/2017 1645 - Progressing by Kathreen CosierMalcolm, Lachandra Dettmann A, RN Clinical Measurements: Ability to maintain a body temperature in the normal range will improve 09/17/2017 1645 - Progressing by Kathreen CosierMalcolm, Tayt Moyers A, RN Respiratory: Ability to maintain adequate ventilation will improve 09/17/2017 1645 - Progressing by Kathreen CosierMalcolm, Haniyyah Sakuma A, RN Ability to maintain a clear airway will improve 09/17/2017 1645 - Progressing by Kathreen CosierMalcolm, Kilee Hedding A, RN Education: Knowledge of General Education information will improve 09/17/2017 1645 - Progressing by Kathreen CosierMalcolm, Nishtha Raider A, RN Clinical Measurements: Will remain free from infection 09/17/2017 1645 - Progressing by Kathreen CosierMalcolm, Garnet Overfield A, RN Activity: Risk for activity intolerance will decrease 09/17/2017 1645 - Progressing by Kathreen CosierMalcolm, Hydee Fleece A, RN Pain Managment: General experience of comfort will improve 09/17/2017 1645 - Progressing by Kathreen CosierMalcolm, Myiah Petkus A, RN Safety: Ability to remain free from injury will improve 09/17/2017 1645 - Progressing by Kathreen CosierMalcolm, Teauna Dubach A, RN Skin Integrity: Risk for impaired skin integrity will decrease 09/17/2017 1645 - Progressing by Kathreen CosierMalcolm, Caydyn Sprung A, RN

## 2017-09-18 MED ORDER — IPRATROPIUM-ALBUTEROL 0.5-2.5 (3) MG/3ML IN SOLN
3.0000 mL | Freq: Four times a day (QID) | RESPIRATORY_TRACT | Status: DC
  Administered 2017-09-18 (×2): 3 mL via RESPIRATORY_TRACT
  Filled 2017-09-18 (×2): qty 3

## 2017-09-18 MED ORDER — PREDNISONE 50 MG PO TABS
50.0000 mg | ORAL_TABLET | Freq: Every day | ORAL | Status: DC
  Administered 2017-09-18 – 2017-09-19 (×2): 50 mg via ORAL
  Filled 2017-09-18 (×2): qty 1

## 2017-09-18 MED ORDER — GUAIFENESIN-DM 100-10 MG/5ML PO SYRP
5.0000 mL | ORAL_SOLUTION | ORAL | Status: DC | PRN
  Filled 2017-09-18: qty 5

## 2017-09-18 MED ORDER — ALBUTEROL SULFATE (2.5 MG/3ML) 0.083% IN NEBU: 2.5000 mg | INHALATION_SOLUTION | RESPIRATORY_TRACT | Status: DC | PRN

## 2017-09-18 MED ORDER — GUAIFENESIN ER 600 MG PO TB12: 600.0000 mg | ORAL_TABLET | Freq: Two times a day (BID) | ORAL | Status: DC

## 2017-09-18 NOTE — Progress Notes (Signed)
[facility] - [place] at [facility]   PATIENT NAME: Margaret Stephens    MR#:  [id number]  DATE OF BIRTH:  [birth year]  SUBJECTIVE:  CHIEF COMPLAINT:   Chief Complaint  Patient presents with  . Shortness of Breath   More congested and wheezing today.  Afebrile.  Good appetite.  Family at bedside.  On 2 L oxygen.  REVIEW OF SYSTEMS:    Review of Systems  Constitutional: Positive for malaise/fatigue. Negative for chills and fever.  HENT: Negative for sore throat.   Eyes: Negative for blurred vision, double vision and pain.  Respiratory: Positive for cough, shortness of breath and wheezing. Negative for hemoptysis.   Cardiovascular: Negative for chest pain, palpitations, orthopnea and leg swelling.  Gastrointestinal: Negative for abdominal pain, constipation, diarrhea, heartburn, nausea and vomiting.  Genitourinary: Negative for dysuria and hematuria.  Musculoskeletal: Negative for back pain and joint pain.  Skin: Negative for rash.  Neurological: Positive for weakness. Negative for sensory change, speech change, focal weakness and headaches.  Endo/Heme/Allergies: Does not bruise/bleed easily.  Psychiatric/Behavioral: Negative for depression. The patient is not nervous/anxious.     DRUG ALLERGIES:  No Known Allergies  VITALS:  Blood pressure (!) 158/74, pulse 89, temperature 98.7 F (37.1 C), temperature source Axillary, resp. rate 16, height 4\' 11"  (1.499 m), weight 57.6 kg (127 lb), SpO2 92 %.  PHYSICAL EXAMINATION:   Physical Exam  GENERAL:  84 y.o.-year-old patient lying in the bed with no acute distress.  EYES: Pupils equal, round, reactive to light and accommodation. No scleral icterus. Extraocular muscles intact.  HEENT: Head atraumatic, normocephalic. Oropharynx and nasopharynx clear.  NECK:  Supple, no jugular venous distention. No thyroid enlargement, no tenderness.  LUNGS: Bilateral wheezing and decreased air entry CARDIOVASCULAR: S1, S2 normal. No  murmurs, rubs, or gallops.  ABDOMEN: Soft, nontender, nondistended. Bowel sounds present. No organomegaly or mass.  EXTREMITIES: No cyanosis, clubbing or edema b/l.    NEUROLOGIC: Cranial nerves II through XII are intact. No focal Motor or sensory deficits b/l.   PSYCHIATRIC: The patient is alert and oriented x 3.  SKIN: No obvious rash, lesion, or ulcer.   LABORATORY PANEL:   CBC Recent Labs  Lab 09/16/17 0033  WBC 6.7  HGB 12.2  HCT 36.2  PLT 166   ------------------------------------------------------------------------------------------------------------------ Chemistries  Recent Labs  Lab 09/16/17 0033  NA 140  K 4.2  CL 104  CO2 27  GLUCOSE 156*  BUN 11  CREATININE 0.49  CALCIUM 8.5*   ------------------------------------------------------------------------------------------------------------------  Cardiac Enzymes Recent Labs  Lab 09/15/17 1912  TROPONINI <0.03   ------------------------------------------------------------------------------------------------------------------  RADIOLOGY:  No results found.   ASSESSMENT AND PLAN:   *Acute hypoxic respiratory failure secondary to acute bronchitis.  Tamiflu. Start prednisone.  Scheduled nebulizers today.  Wean oxygen as tolerated.  Add Mucinex.  Flutter valve.  Cough medication. Ambulate patient today.  Discharge in 1-2 days.  All the records are reviewed and case discussed with Care Management/Social Worker Management plans discussed with the patient, family and they are in agreement.  CODE STATUS: FULL CODE  DVT Prophylaxis: SCDs  TOTAL TIME TAKING CARE OF THIS PATIENT: 35 minutes.   Molinda BailiffSrikar R Yuka Lallier M.D on 09/18/2017 at 12:19 PM  Between 7am to 6pm - Pager - [phone]  After 6pm go to [email or web address] - password [email or web address] [email or web address]  [facility]  [facility]  Office  [phone]  CC: Primary care physician; Danella PentonMiller, Mark F, MD  Note: This dictation was prepared with Dragon dictation along with  smaller phrase technology. Any transcriptional errors that result from this process are unintentional.

## 2017-09-18 NOTE — Progress Notes (Signed)
Advance care planning  Met with patient with son, daughter-in-law and granddaughter at bedside.  Discussed regarding patient's acute bronchitis with wheezing.  Explained that prednisone would help the most.  Oxygen and breathing treatments are for her symptoms.  We discussed at length regarding acute bronchitis, treatment plan, prognosis and expected course.  Explained that patient will likely need oxygen at discharge if saturations are low today.  Discussed regarding CODE STATUS.  Family very reluctant to discuss and want everything done.  Patient is full code.  Time spent 20 minutes.

## 2017-09-18 NOTE — Plan of Care (Signed)
VSS, free of falls during shift.  Denies pain, no complaints overnight.  Received evening meds @ 2000 per pt request.  Asleep majority of shift.  Granddaughter at bedside, bed in low position.  Call bell within reach, WCTM.

## 2017-09-18 NOTE — Plan of Care (Signed)
  Activity: Ability to tolerate increased activity will improve 09/18/2017 1955 - Progressing by Donnel SaxonKennedy, Tremain Rucinski L, RN   Clinical Measurements: Ability to maintain a body temperature in the normal range will improve 09/18/2017 1955 - Progressing by Donnel SaxonKennedy, Ranferi Clingan L, RN   Respiratory: Ability to maintain adequate ventilation will improve 09/18/2017 1955 - Progressing by Donnel SaxonKennedy, Korbyn Vanes L, RN Ability to maintain a clear airway will improve 09/18/2017 1955 - Progressing by Donnel SaxonKennedy, Daquarius Dubeau L, RN   Education: Knowledge of General Education information will improve 09/18/2017 1955 - Progressing by Donnel SaxonKennedy, Abisai Coble L, RN   Clinical Measurements: Will remain free from infection 09/18/2017 1955 - Progressing by Donnel SaxonKennedy, Gabryela Kimbrell L, RN   Activity: Risk for activity intolerance will decrease 09/18/2017 1955 - Progressing by Donnel SaxonKennedy, Abdishakur Gottschall L, RN   Pain Managment: General experience of comfort will improve 09/18/2017 1955 - Progressing by Donnel SaxonKennedy, Keia Rask L, RN   Safety: Ability to remain free from injury will improve 09/18/2017 1955 - Progressing by Donnel SaxonKennedy, Price Lachapelle L, RN   Skin Integrity: Risk for impaired skin integrity will decrease 09/18/2017 1955 - Progressing by Donnel SaxonKennedy, Ginia Rudell L, RN

## 2017-09-18 NOTE — Progress Notes (Signed)
Patient up in chair on room air, 95%. Unable to ambulate.

## 2017-09-19 LAB — CULTURE, BLOOD (ROUTINE X 2): SPECIAL REQUESTS: ADEQUATE

## 2017-09-19 MED ORDER — ALBUTEROL SULFATE HFA 108 (90 BASE) MCG/ACT IN AERS: 2.0000 | INHALATION_SPRAY | Freq: Four times a day (QID) | RESPIRATORY_TRACT | 0 refills | Status: DC | PRN

## 2017-09-19 MED ORDER — PREDNISONE 10 MG (21) PO TBPK: ORAL_TABLET | ORAL | 0 refills | Status: DC

## 2017-09-19 MED ORDER — IPRATROPIUM-ALBUTEROL 0.5-2.5 (3) MG/3ML IN SOLN
3.0000 mL | Freq: Three times a day (TID) | RESPIRATORY_TRACT | Status: DC
Start: 2017-09-19 — End: 2017-09-19
  Administered 2017-09-19 (×2): 3 mL via RESPIRATORY_TRACT
  Filled 2017-09-19 (×2): qty 3

## 2017-09-19 NOTE — Progress Notes (Signed)
Patient discharged with family, both IV's removed, patient tolerated well. Family and patient verbalized understanding of discharge instructions. Patient with no complaints.

## 2017-09-19 NOTE — Care Management Note (Signed)
Case Management Note  Patient Details  Name: Margaret Stephens MRN: [id number] Date of Birth: 1925-03-27  Subjective/Objective:     [facility] for HH[facility]PT, RN.               Action/Plan:   Expected Discharge Date:  09/19/17               Expected Discharge Plan:     In-House Referral:     Discharge planning Services     Post Acute Care Choice:    Choice offered to:     DME Arranged:    DME Agency:     HH Arranged:    HH Agency:     Status of Service:     If discussed at [employer] Meetings, dates discussed:    Additional Comments:  Zykiria Bruening A, RN 09/19/2017, 3:33 PM

## 2017-09-19 NOTE — Progress Notes (Signed)
Ambulated patient in hall on RA, 95%, tolerated well. Family at bedside. Notified Dr. Elpidio AnisSudini.

## 2017-09-19 NOTE — Plan of Care (Signed)
VSS, free of falls during shift.  Denies pain, no complaints overnight.  Family at bedside, low bed.  Call bell within reach, WCTM.

## 2017-09-20 LAB — CULTURE, BLOOD (ROUTINE X 2)
CULTURE: NO GROWTH
Special Requests: ADEQUATE

## 2017-09-24 NOTE — Discharge Summary (Signed)
[facility] [place] at [facility]   PATIENT NAME: Margaret Stephens    MR#:  [id number]  DATE OF BIRTH:  1924-08-06  DATE OF ADMISSION:  09/15/2017 ADMITTING PHYSICIAN: Adrian Saran, MD  DATE OF DISCHARGE: 09/19/2017  3:37 PM  PRIMARY CARE PHYSICIAN: Danella Penton, MD   ADMISSION DIAGNOSIS:  Pneumonia [J18.9] Bronchitis [J40] Hypoxia [R09.02] Fever, unspecified fever cause [R50.9]  DISCHARGE DIAGNOSIS:  Active Problems:   Hypoxia   SECONDARY DIAGNOSIS:  History reviewed. No pertinent past medical history.   ADMITTING HISTORY  HISTORY OF PRESENT ILLNESS:  Margaret Stephens  is a 84 y.o. female with a known history of anxiety and vitamin D deficiency who presents today to the emergency room with shortness of breath and wheezing. Patient reports that she was treated on Sunday for influenza on Tamiflu and Tessalon Perles. She says that she has had a fever, cough and shortness of breath. Her oxygen saturation was 67% upon arrival. She is currently saturating 94 percent on 2 L nasal cannula.  HOSPITAL COURSE:   *Acute hypoxic respiratory failure secondary to acute bronchitis.  Influenza A. Patient was started on Tamiflu as outpatient.  This was continued in the hospital and finished 5-day course.  Also started on IV Solu-Medrol and later transition to prednisone.  Scheduled nebulizers.  Oxygen support.  Patient slowly improved and by day of discharge she has no wheezing.  Continues to have cough and weakness.  She has ambulated in the hallway and saturations are 94% on room air.  Patient is being discharged home on prednisone taper.  Stable for discharge home.  CONSULTS OBTAINED:    DRUG ALLERGIES:  No Known Allergies  DISCHARGE MEDICATIONS:   Allergies as of 09/19/2017   No Known Allergies     Medication List    STOP taking these medications   oseltamivir 75 MG capsule Commonly known as:  TAMIFLU     TAKE these medications   albuterol 108 (90 Base) MCG/ACT  inhaler Commonly known as:  PROVENTIL HFA;VENTOLIN HFA Inhale 2 puffs into the lungs every 6 (six) hours as needed for wheezing or shortness of breath.   alendronate 70 MG tablet Commonly known as:  FOSAMAX Take 70 mg by mouth once a week. Take with a full glass of water on an empty stomach.   aspirin EC 81 MG tablet Take 81 mg by mouth daily.   benzonatate 200 MG capsule Commonly known as:  TESSALON Take 1 capsule by mouth 3 (three) times daily.   fluticasone 50 MCG/ACT nasal spray Commonly known as:  FLONASE Place 1 spray into both nostrils 2 (two) times daily.   LORazepam 0.5 MG tablet Commonly known as:  ATIVAN Take 0.5 mg by mouth 2 (two) times daily.   meclizine 12.5 MG tablet Commonly known as:  ANTIVERT Take 1 tablet (12.5 mg total) by mouth 3 (three) times daily.   predniSONE 10 MG (21) Tbpk tablet Commonly known as:  STERAPRED UNI-PAK 21 TAB 6 tabs day 1 and taper 1 tab a day - 6 days   simvastatin 40 MG tablet Commonly known as:  ZOCOR Take 40 mg by mouth daily.   traMADol 50 MG tablet Commonly known as:  ULTRAM Take 1 tablet (50 mg total) by mouth every 6 (six) hours as needed.       Today   VITAL SIGNS:  Blood pressure (!) 164/92, pulse 76, temperature 98.4 F (36.9 C), temperature source Oral, resp. rate 16, height 4\' 11"  (1.499 m),  weight 57.6 kg (127 lb), SpO2 95 %.  I/O:  No intake or output data in the 24 hours ending 09/24/17 1120  PHYSICAL EXAMINATION:  Physical Exam  GENERAL:  84 y.o.-year-old patient lying in the bed with no acute distress.  LUNGS: Normal breath sounds bilaterally, no wheezing, rales,rhonchi or crepitation. No use of accessory muscles of respiration.  CARDIOVASCULAR: S1, S2 normal. No murmurs, rubs, or gallops.  ABDOMEN: Soft, non-tender, non-distended. Bowel sounds present. No organomegaly or mass.  NEUROLOGIC: Moves all 4 extremities. PSYCHIATRIC: The patient is alert and oriented x 3.  SKIN: No obvious rash, lesion,  or ulcer.   DATA REVIEW:   CBC No results for input(s): WBC, HGB, HCT, PLT in the last 168 hours.  Chemistries  No results for input(s): NA, K, CL, CO2, GLUCOSE, BUN, CREATININE, CALCIUM, MG, AST, ALT, ALKPHOS, BILITOT in the last 168 hours.  Invalid input(s): GFRCGP  Cardiac Enzymes No results for input(s): TROPONINI in the last 168 hours.  Microbiology Results  Results for orders placed or performed during the hospital encounter of 09/15/17  Blood Culture (routine x 2)     Status: None   Collection Time: 09/15/17  8:51 PM  Result Value Ref Range Status   Specimen Description BLOOD BLOOD RIGHT FOREARM  Final   Special Requests   Final    BOTTLES DRAWN AEROBIC AND ANAEROBIC Blood Culture adequate volume   Culture   Final    NO GROWTH 5 DAYS Performed at [facility], [address] [place], [place] [address]    Report Status 09/20/2017 FINAL  Final  Blood Culture (routine x 2)     Status: Abnormal   Collection Time: 09/15/17  8:56 PM  Result Value Ref Range Status   Specimen Description   Final    BLOOD BLOOD LEFT WRIST Performed at [facility], [address] [place], [place] [address]    Special Requests   Final    BOTTLES DRAWN AEROBIC AND ANAEROBIC Blood Culture adequate volume Performed at [facility], [address] [place], [place] [address]    Culture  Setup Time   Final    GRAM POSITIVE COCCI IN BOTH AEROBIC AND ANAEROBIC BOTTLES CRITICAL RESULT CALLED TO, READ BACK BY AND VERIFIED WITH: JASON ROBINS AT 1822 09/16/2017.  TFK Performed at [facility], [address] [place], [place] [address]    Culture (A)  Final    STAPHYLOCOCCUS SPECIES (COAGULASE NEGATIVE) THE SIGNIFICANCE OF ISOLATING THIS ORGANISM FROM A SINGLE SET OF BLOOD CULTURES WHEN MULTIPLE SETS ARE DRAWN IS UNCERTAIN. PLEASE NOTIFY THE MICROBIOLOGY DEPARTMENT WITHIN ONE WEEK IF SPECIATION AND SENSITIVITIES ARE REQUIRED. Performed at [facility], [address] [place], [place] [address]    Report Status 09/19/2017 FINAL  Final  Blood Culture ID Panel (Reflexed)     Status: Abnormal   Collection Time: 09/15/17  8:56 PM  Result Value Ref Range Status   Enterococcus species NOT DETECTED NOT DETECTED Final   Listeria monocytogenes NOT DETECTED NOT DETECTED Final   Staphylococcus species DETECTED (A) NOT DETECTED Final    Comment: Methicillin (oxacillin) susceptible coagulase negative staphylococcus. Possible blood culture contaminant (unless isolated from more than one blood culture draw or clinical case suggests pathogenicity). No antibiotic treatment is indicated for blood  culture contaminants. CRITICAL RESULT CALLED TO, READ BACK BY AND VERIFIED WITH: JASON ROBINS AT 1822 09/16/2017 BY TFK    Staphylococcus aureus NOT DETECTED NOT DETECTED Final  Methicillin resistance NOT DETECTED NOT DETECTED Final   Streptococcus species NOT DETECTED NOT DETECTED Final   Streptococcus agalactiae NOT DETECTED NOT DETECTED Final   Streptococcus pneumoniae NOT DETECTED NOT DETECTED Final   Streptococcus pyogenes NOT DETECTED NOT DETECTED Final   Acinetobacter baumannii NOT DETECTED NOT DETECTED Final   Enterobacteriaceae species NOT DETECTED NOT DETECTED Final   Enterobacter cloacae complex NOT DETECTED NOT DETECTED Final   Escherichia coli NOT DETECTED NOT DETECTED Final   Klebsiella oxytoca NOT DETECTED NOT DETECTED Final   Klebsiella pneumoniae NOT DETECTED NOT DETECTED Final   Proteus species NOT DETECTED NOT DETECTED Final   Serratia marcescens NOT DETECTED NOT DETECTED Final   Haemophilus influenzae NOT DETECTED NOT DETECTED Final   Neisseria meningitidis NOT DETECTED NOT DETECTED Final   Pseudomonas aeruginosa NOT DETECTED NOT DETECTED Final   Candida albicans NOT DETECTED NOT DETECTED Final   Candida glabrata NOT DETECTED NOT DETECTED Final   Candida krusei NOT DETECTED NOT DETECTED Final   Candida parapsilosis NOT DETECTED NOT  DETECTED Final   Candida tropicalis NOT DETECTED NOT DETECTED Final    Comment: Performed at [facility], [address] [place], [place] [address]  Urine culture     Status: None   Collection Time: 09/15/17 11:02 PM  Result Value Ref Range Status   Specimen Description   Final    URINE, RANDOM Performed at [facility], [address], [place], [place] [address]    Special Requests   Final    NONE Performed at [facility], [address] [place], [place] [address]    Culture   Final    NO GROWTH Performed at [facility], [address] [place], [place] [address]    Report Status 09/17/2017 FINAL  Final    RADIOLOGY:  No results found.  Follow up with PCP in 1 week.  Management plans discussed with the patient, family and they are in agreement.  CODE STATUS:  Code Status History    Date Active Date Inactive Code Status Order ID Comments User Context   09/15/2017 20:43 09/19/2017 19:17 Full Code [id number]  Adrian Saran, MD ED   09/15/2017 20:31 09/15/2017 20:43 DNR [id number]  Adrian Saran, MD ED   05/21/2017 16:28 05/23/2017 13:44 Full Code [id number]  Katha Hamming, MD ED    Advance Directive Documentation     Most Recent Value  Type of Advance Directive  Healthcare Power of Attorney, Living will  Pre-existing out of facility DNR order (yellow form or pink MOST form)  No data  "MOST" Form in Place?  No data      TOTAL TIME TAKING CARE OF THIS PATIENT ON DAY OF DISCHARGE: more than 30 minutes.   Molinda Bailiff Brookelin Felber M.D on 09/24/2017 at 11:20 AM  Between 7am to 6pm - Pager - [phone]  After 6pm go to [email or web address] - password [email or web address]  [facility] [place] [facility]  Office  [phone]  CC: Primary care physician; Danella Penton, MD  Note: This dictation was prepared with Dragon dictation along with smaller phrase technology. Any transcriptional errors that result from this process are unintentional.

## 2019-04-22 ENCOUNTER — Inpatient Hospital Stay
Admission: EM | Admit: 2019-04-22 | Discharge: 2019-04-25 | DRG: 517 | Disposition: A | Discharge status: Home Health | Payer: Medicare Other | Attending: Internal Medicine | Admitting: Internal Medicine

## 2019-04-22 ENCOUNTER — Inpatient Hospital Stay: Payer: Medicare Other

## 2019-04-22 ENCOUNTER — Emergency Department: Payer: Medicare Other

## 2019-04-22 ENCOUNTER — Other Ambulatory Visit: Payer: Self-pay

## 2019-04-22 ENCOUNTER — Encounter: Payer: Self-pay | Admitting: Emergency Medicine

## 2019-04-22 DIAGNOSIS — K449 Diaphragmatic hernia without obstruction or gangrene: Secondary | ICD-10-CM | POA: Diagnosis present

## 2019-04-22 DIAGNOSIS — M549 Dorsalgia, unspecified: Secondary | ICD-10-CM

## 2019-04-22 DIAGNOSIS — Z79899 Other long term (current) drug therapy: Secondary | ICD-10-CM

## 2019-04-22 DIAGNOSIS — T40695A Adverse effect of other narcotics, initial encounter: Secondary | ICD-10-CM | POA: Diagnosis not present

## 2019-04-22 DIAGNOSIS — Z79891 Long term (current) use of opiate analgesic: Secondary | ICD-10-CM | POA: Diagnosis not present

## 2019-04-22 DIAGNOSIS — R1084 Generalized abdominal pain: Secondary | ICD-10-CM | POA: Diagnosis present

## 2019-04-22 DIAGNOSIS — Z7982 Long term (current) use of aspirin: Secondary | ICD-10-CM | POA: Diagnosis not present

## 2019-04-22 DIAGNOSIS — Z833 Family history of diabetes mellitus: Secondary | ICD-10-CM

## 2019-04-22 DIAGNOSIS — S32010A Wedge compression fracture of first lumbar vertebra, initial encounter for closed fracture: Secondary | ICD-10-CM

## 2019-04-22 DIAGNOSIS — R112 Nausea with vomiting, unspecified: Secondary | ICD-10-CM | POA: Diagnosis not present

## 2019-04-22 DIAGNOSIS — E785 Hyperlipidemia, unspecified: Secondary | ICD-10-CM | POA: Diagnosis present

## 2019-04-22 DIAGNOSIS — R0902 Hypoxemia: Secondary | ICD-10-CM | POA: Diagnosis present

## 2019-04-22 DIAGNOSIS — M8008XA Age-related osteoporosis with current pathological fracture, vertebra(e), initial encounter for fracture: Secondary | ICD-10-CM | POA: Diagnosis present

## 2019-04-22 DIAGNOSIS — Z66 Do not resuscitate: Secondary | ICD-10-CM | POA: Diagnosis present

## 2019-04-22 DIAGNOSIS — F419 Anxiety disorder, unspecified: Secondary | ICD-10-CM | POA: Diagnosis present

## 2019-04-22 DIAGNOSIS — S32000A Wedge compression fracture of unspecified lumbar vertebra, initial encounter for closed fracture: Secondary | ICD-10-CM | POA: Diagnosis present

## 2019-04-22 DIAGNOSIS — Z20828 Contact with and (suspected) exposure to other viral communicable diseases: Secondary | ICD-10-CM | POA: Diagnosis present

## 2019-04-22 DIAGNOSIS — Z419 Encounter for procedure for purposes other than remedying health state, unspecified: Secondary | ICD-10-CM

## 2019-04-22 HISTORY — DX: Age-related osteoporosis without current pathological fracture: M81.0

## 2019-04-22 HISTORY — DX: Pure hypercholesterolemia, unspecified: E78.00

## 2019-04-22 HISTORY — DX: Dizziness and giddiness: R42

## 2019-04-22 HISTORY — DX: Anxiety disorder, unspecified: F41.9

## 2019-04-22 LAB — CBC
HCT: 41.6 % (ref 36.0–46.0)
Hemoglobin: 13.9 g/dL (ref 12.0–15.0)
MCH: 30.8 pg (ref 26.0–34.0)
MCHC: 33.4 g/dL (ref 30.0–36.0)
MCV: 92.2 fL (ref 80.0–100.0)
Platelets: 182 10*3/uL (ref 150–400)
RBC: 4.51 MIL/uL (ref 3.87–5.11)
RDW: 13.3 % (ref 11.5–15.5)
WBC: 7.9 10*3/uL (ref 4.0–10.5)
nRBC: 0 % (ref 0.0–0.2)

## 2019-04-22 LAB — URINALYSIS, COMPLETE (UACMP) WITH MICROSCOPIC
Bacteria, UA: NONE SEEN
Bilirubin Urine: NEGATIVE
Glucose, UA: NEGATIVE mg/dL
Hgb urine dipstick: NEGATIVE
Ketones, ur: 20 mg/dL — AB
Leukocytes,Ua: NEGATIVE
Nitrite: NEGATIVE
Protein, ur: NEGATIVE mg/dL
Specific Gravity, Urine: >1.046 — ABNORMAL HIGH (ref 1.005–1.030)
Squamous Epithelial / LPF: NONE SEEN (ref 0–5)
pH: 5 (ref 5.0–8.0)

## 2019-04-22 LAB — COMPREHENSIVE METABOLIC PANEL
ALT: 13 U/L (ref 0–44)
AST: 21 U/L (ref 15–41)
Albumin: 3.9 g/dL (ref 3.5–5.0)
Alkaline Phosphatase: 64 U/L (ref 38–126)
Anion gap: 10 (ref 5–15)
BUN: 9 mg/dL (ref 8–23)
CO2: 29 mmol/L (ref 22–32)
Calcium: 9.5 mg/dL (ref 8.9–10.3)
Chloride: 100 mmol/L (ref 98–111)
Creatinine, Ser: 0.6 mg/dL (ref 0.44–1.00)
GFR calc Af Amer: >60 mL/min (ref >60)
GFR calc non Af Amer: >60 mL/min (ref >60)
Glucose, Bld: 132 mg/dL — ABNORMAL HIGH (ref 70–99)
Potassium: 4.2 mmol/L (ref 3.5–5.1)
Sodium: 139 mmol/L (ref 135–145)
Total Bilirubin: 0.9 mg/dL (ref 0.3–1.2)
Total Protein: 7.4 g/dL (ref 6.5–8.1)

## 2019-04-22 LAB — BLOOD GAS, ARTERIAL
Acid-Base Excess: 3.1 mmol/L — ABNORMAL HIGH (ref 0.0–2.0)
Allens test (pass/fail): POSITIVE — AB
Bicarbonate: 30.2 mmol/L — ABNORMAL HIGH (ref 20.0–28.0)
FIO2: 36
O2 Saturation: 98.8 %
Patient temperature: 37
pCO2 arterial: 56 mmHg — ABNORMAL HIGH (ref 32.0–48.0)
pH, Arterial: 7.34 — ABNORMAL LOW (ref 7.350–7.450)
pO2, Arterial: 130 mmHg — ABNORMAL HIGH (ref 83.0–108.0)

## 2019-04-22 LAB — LIPASE, BLOOD: Lipase: 24 U/L (ref 11–51)

## 2019-04-22 MED ORDER — FENTANYL CITRATE (PF) 100 MCG/2ML IJ SOLN
50.0000 ug | Freq: Once | INTRAMUSCULAR | Status: AC
  Administered 2019-04-22: 50 ug via INTRAVENOUS
  Filled 2019-04-22: qty 2

## 2019-04-22 MED ORDER — SIMVASTATIN 20 MG PO TABS
40.0000 mg | ORAL_TABLET | Freq: Every evening | ORAL | Status: DC
  Administered 2019-04-22 – 2019-04-24 (×3): 40 mg via ORAL
  Filled 2019-04-22 (×4): qty 2

## 2019-04-22 MED ORDER — VITAMIN D 25 MCG (1000 UNIT) PO TABS
1000.0000 [IU] | ORAL_TABLET | Freq: Every day | ORAL | Status: DC
  Administered 2019-04-23 – 2019-04-25 (×2): 1000 [IU] via ORAL
  Filled 2019-04-22 (×3): qty 1

## 2019-04-22 MED ORDER — SODIUM CHLORIDE 0.9% FLUSH
3.0000 mL | Freq: Once | INTRAVENOUS | Status: AC
  Administered 2019-04-22: 3 mL via INTRAVENOUS

## 2019-04-22 MED ORDER — IOHEXOL 300 MG/ML  SOLN
75.0000 mL | Freq: Once | INTRAMUSCULAR | Status: AC | PRN
  Administered 2019-04-22: 75 mL via INTRAVENOUS

## 2019-04-22 MED ORDER — MECLIZINE HCL 12.5 MG PO TABS
12.5000 mg | ORAL_TABLET | Freq: Three times a day (TID) | ORAL | Status: DC
  Administered 2019-04-22 – 2019-04-25 (×4): 12.5 mg via ORAL
  Filled 2019-04-22 (×10): qty 1

## 2019-04-22 MED ORDER — OXYCODONE-ACETAMINOPHEN 5-325 MG PO TABS: 1.0000 | ORAL_TABLET | Freq: Four times a day (QID) | ORAL | 0 refills | Status: AC | PRN

## 2019-04-22 MED ORDER — ONDANSETRON HCL 4 MG/2ML IJ SOLN: 4.0000 mg | Freq: Four times a day (QID) | INTRAMUSCULAR | Status: DC | PRN

## 2019-04-22 MED ORDER — OXYCODONE-ACETAMINOPHEN 5-325 MG PO TABS: 1.0000 | ORAL_TABLET | ORAL | Status: DC | PRN

## 2019-04-22 MED ORDER — ONDANSETRON HCL 4 MG/2ML IJ SOLN
4.0000 mg | Freq: Once | INTRAMUSCULAR | Status: AC
  Administered 2019-04-22: 4 mg via INTRAVENOUS
  Filled 2019-04-22: qty 2

## 2019-04-22 MED ORDER — LORAZEPAM 0.5 MG PO TABS
0.5000 mg | ORAL_TABLET | Freq: Two times a day (BID) | ORAL | Status: DC
  Administered 2019-04-22 – 2019-04-25 (×5): 0.5 mg via ORAL
  Filled 2019-04-22 (×6): qty 1

## 2019-04-22 MED ORDER — HYDROMORPHONE HCL 1 MG/ML IJ SOLN
0.5000 mg | Freq: Once | INTRAMUSCULAR | Status: AC
  Administered 2019-04-22: 0.5 mg via INTRAVENOUS
  Filled 2019-04-22: qty 1

## 2019-04-22 MED ORDER — ASPIRIN EC 81 MG PO TBEC
81.0000 mg | DELAYED_RELEASE_TABLET | Freq: Every evening | ORAL | Status: DC
  Administered 2019-04-22 – 2019-04-24 (×3): 81 mg via ORAL
  Filled 2019-04-22 (×4): qty 1

## 2019-04-22 NOTE — H&P (Signed)
[place] [facility] [place] NAME: Margaret Stephens    MR#:  [id number]  DATE OF BIRTH:  April 13, 1925  DATE OF ADMISSION:  04/22/2019  PRIMARY CARE PHYSICIAN: Rusty Aus, MD   REQUESTING/REFERRING PHYSICIAN: Carrie Mew  CHIEF COMPLAINT:   Chief Complaint  Patient presents with   Abdominal Pain  Low back pain  HISTORY OF PRESENT ILLNESS:  Margaret Stephens  is a 85 y.o. female with a known history of osteoporosis, hyperlipidemia and vertigo who presented to the emergency room with complaints of low back pain radiating to his lower abdomen for the past 2 to 3 days.  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  Patient denied having any fall or any trauma.  The pain got worse last night which prompted patient to come to the emergency room today.  No fevers.  Patient was evaluated in the emergency room and on arrival oxygen saturation was 95% on room air.  After patient was given pain medication with IV Dilaudid in the emergency room patient noted to have become hypoxic with oxygen saturation down to the 80s and was placed on 2 L.  At the time of my evaluation patient resting comfortably and pain appears well-controlled and oxygen saturation improved to 100% with supplemental oxygen. CT scan of the abdomen and pelvis done revealed new acute mild L1 superior endplate compression fracture. Unchanged large hiatal hernia.  Medical service called to admit patient for further evaluation.  PAST MEDICAL HISTORY:   Past Medical History:  Diagnosis Date   Anxiety    High cholesterol    Osteoporosis    Vertigo     PAST SURGICAL HISTORY:   Past Surgical History:  Procedure Laterality Date   KNEE ARTHROSCOPY Left    RECTAL SURGERY      SOCIAL HISTORY:   Social History   Tobacco Use   Smoking  status: Never Smoker   Smokeless tobacco: Never Used  Substance Use Topics   Alcohol use: No    FAMILY HISTORY:  Family history significant for diabetes mellitus but no coronary artery disease.  DRUG ALLERGIES:  No Known Allergies  REVIEW OF SYSTEMS:   Review of Systems  Constitutional: Negative for chills and fever.  HENT: Negative for hearing loss and tinnitus.   Eyes: Negative for blurred vision and double vision.  Respiratory: Negative for cough and shortness of breath.   Cardiovascular: Negative for chest pain and palpitations.  Gastrointestinal: Positive for nausea and vomiting. Negative for abdominal pain.  Genitourinary: Negative for dysuria.  Musculoskeletal: Negative for myalgias.       Back pain on presentation which is now resolved with IV narcotics  Skin: Negative for itching and rash.  Neurological: Negative for dizziness and headaches.  Psychiatric/Behavioral: Negative for depression and hallucinations.    MEDICATIONS AT HOME:   Prior to Admission medications   Medication Sig Start Date End Date Taking? Authorizing Provider  aspirin EC 81 MG tablet Take 81 mg by mouth every evening.    Yes [provider]  cholecalciferol (VITAMIN D3) 25 MCG (1000 UT) tablet Take 1,000 Units by mouth daily.   Yes [provider]  LORazepam (ATIVAN) 0.5 MG tablet Take 0.5 mg by mouth 2 (two) times daily.    Yes [provider]  meclizine (ANTIVERT) 12.5  MG tablet Take 1 tablet (12.5 mg total) by mouth 3 (three) times daily. Patient taking differently: Take 12.5 mg by mouth 3 (three) times daily as needed for dizziness.  05/23/17  Yes Enid Baas, MD  simvastatin (ZOCOR) 40 MG tablet Take 40 mg by mouth every evening.    Yes [provider]  oxyCODONE-acetaminophen (PERCOCET) 5-325 MG tablet Take 1 tablet by mouth every 6 (six) hours as needed for up to 3 days for severe pain. 04/22/19 04/25/19  Sharman Cheek, MD      VITAL SIGNS:    Blood pressure (!) 162/101, pulse 89, temperature 98.1 F (36.7 C), temperature source Oral, resp. rate 16, height 4\' 11"  (1.499 m), weight 59 kg, SpO2 100 %.  PHYSICAL EXAMINATION:  Physical Exam  GENERAL:  85 y.o.-year-old patient lying in the bed with no acute distress.  EYES: Pupils equal, round, reactive to light and accommodation. No scleral icterus. Extraocular muscles intact.  HEENT: Head atraumatic, normocephalic. Oropharynx and nasopharynx clear.  NECK:  Supple, no jugular venous distention. No thyroid enlargement, no tenderness.  LUNGS: Normal breath sounds bilaterally, no wheezing, rales,rhonchi or crepitation. No use of accessory muscles of respiration.  CARDIOVASCULAR: S1, S2 normal. No murmurs, rubs, or gallops.  ABDOMEN: Soft, nontender, nondistended. Bowel sounds present. No organomegaly or mass.  EXTREMITIES: No pedal edema, cyanosis, or clubbing.  NEUROLOGIC: Cranial nerves II through XII are intact. Muscle strength 5/5 in all extremities. Sensation intact. Gait not checked.  PSYCHIATRIC: The patient is alert and oriented x 3.  SKIN: No obvious rash, lesion, or ulcer.   LABORATORY PANEL:   CBC Recent Labs  Lab 04/22/19 1140  WBC 7.9  HGB 13.9  HCT 41.6  PLT 182   ------------------------------------------------------------------------------------------------------------------  Chemistries  Recent Labs  Lab 04/22/19 1140  NA 139  K 4.2  CL 100  CO2 29  GLUCOSE 132*  BUN 9  CREATININE 0.60  CALCIUM 9.5  AST 21  ALT 13  ALKPHOS 64  BILITOT 0.9   ------------------------------------------------------------------------------------------------------------------  Cardiac Enzymes No results for input(s): TROPONINI in the last 168 hours. ------------------------------------------------------------------------------------------------------------------  RADIOLOGY:  Ct Abdomen Pelvis W Contrast  Result Date: 04/22/2019 CLINICAL DATA:  Abdominal pain.  EXAM: CT ABDOMEN AND PELVIS WITH CONTRAST TECHNIQUE: Multidetector CT imaging of the abdomen and pelvis was performed using the standard protocol following bolus administration of intravenous contrast. CONTRAST:  62mL OMNIPAQUE IOHEXOL 300 MG/ML  SOLN COMPARISON:  CT abdomen pelvis dated May 21, 2017. FINDINGS: Lower chest: No acute abnormality. Unchanged scarring in the left lower lobe. Unchanged large hiatal hernia. Hepatobiliary: No focal liver abnormality is seen. No gallstones, gallbladder wall thickening, or biliary dilatation. Pancreas: Unremarkable. No pancreatic ductal dilatation or surrounding inflammatory changes. Spleen: Normal in size without focal abnormality. Adrenals/Urinary Tract: The adrenal glands are unremarkable. Unchanged 5 mm right renal calculus. New punctate left renal calculus in the lower pole. No hydronephrosis. Mild anterior bladder wall thickening, favored related to underdistention of this area. Stomach/Bowel: Hiatal hernia, as above. No bowel wall thickening, distention, or surrounding inflammatory changes. Normal appendix (series 5, image 50). Vascular/Lymphatic: Aortic atherosclerosis. No enlarged abdominal or pelvic lymph nodes. Reproductive: Uterus and bilateral adnexa are unremarkable. Other: No free fluid or pneumoperitoneum. Musculoskeletal: New acute mild L1 superior endplate compression fracture. No retropulsion. IMPRESSION: 1. New acute mild L1 superior endplate compression fracture. 2. Unchanged large hiatal hernia. 3. Bilateral nephrolithiasis. 4.  Aortic atherosclerosis (ICD10-I70.0). Electronically Signed   By: Obie Dredge M.D.   On: 04/22/2019 13:02  IMPRESSION AND PLAN:  Patient is a 85 year old female with history of hyperlipidemia and osteoporosis who presented to the emergency room with complaints of back pain following doing some work in her toilet about 3 days ago.  1.  Compression fracture of the L1 superior endplate No fall or trauma.   Symptoms started following patient doing some walk and half tablet about 3 days ago.  Still able to ambulate but with significant pains. Pain is better controlled with IV Dilaudid administered in the emergency room. Will not continue with IV narcotics since patient became hypoxic following administration of narcotics in the emergency room.  Pain control with p.o. Percocet as needed I have requested for orthopedic consult and Dr. Ernest PineHooten has requested for MRI of the lumbar spine and he will see patient in consultation. Patient had some mild nausea and vomiting following administration of Dilaudid.  Now resolved.  Placed on PRN Zofran.  2.  Hypoxia Oxygen saturation was 95% on room air on presentation.  Patient became hypoxic following administration of narcotics. Currently on supplemental oxygen via nasal cannula with oxygen saturation 100%. Requested for stat chest x-ray and ABG. Anticipate resolution of hypoxia once effective narcotics wears off.  3.  Hyperlipidemia Continue statins.  DVT prophylaxis; SCDs for now To Lovenox if no plans for surgery following orthopedic physician's evaluation   All the records are reviewed and case discussed with ED provider. Management plans discussed with the patient, family and they are in agreement. Updated patient's son at bedside who agrees with plan of care as outlined.  He also agrees with patient's decision to be DNR/DNI.  CODE STATUS: DNR/DNI  TOTAL TIME TAKING CARE OF THIS PATIENT: 61 minutes.    Yamilee Harmes M.D on 04/22/2019 at 3:55 PM  Between 7am to 6pm - Pager - [phone]  After 6pm go to [email or web address] - Social research officer, [email or web address] [email or web address]  [facility] [place] [facility]  Office  [phone]  CC: Primary care physician; Danella PentonMiller, Mark F, MD   Note: This dictation was prepared with Dragon dictation along with smaller phrase technology. Any transcriptional errors that result from this process are unintentional.

## 2019-04-22 NOTE — Progress Notes (Signed)
Patient admitted from ED via stretcher. Alert and oriented. Son with patient.  Pt has no c/o pain unless she moves or turns, then c/o lower back pain.  CT reveals compression fx of L1 per son.  Patient oriented to call bell button for nurse/tech.

## 2019-04-22 NOTE — ED Notes (Signed)
Pt speaking to [facility]

## 2019-04-22 NOTE — ED Notes (Signed)
Patient transported to CT 

## 2019-04-22 NOTE — ED Notes (Signed)
abg being obtained and then pt can go to the floor

## 2019-04-22 NOTE — Discharge Instructions (Signed)
Take percocet as needed for pain, and be sure to allow others to assist you with ambulation to prevent falls. Follow up with orthopedics this week for continued evaluation. It will be helpful to take a stool softener such as Senokot while taking percocet to prevent constipation side effects.

## 2019-04-22 NOTE — ED Notes (Signed)
sp02 dropped with dilaudid - pt alert, answering questions. Pt placed on 4 liters

## 2019-04-22 NOTE — ED Notes (Signed)
Attempted to sit patient up and take off oxygen. Pt's pain increased and she vomited from the pain. After a few mins her oxygen also dropped into the mid 80's and did not go back up - pt back on 4 liters

## 2019-04-22 NOTE — ED Notes (Signed)
Pt to mri and then will go to the floor

## 2019-04-22 NOTE — ED Provider Notes (Addendum)
[facility] Emergency Department Provider Note  ____________________________________________  Time seen: Approximately 2:19 PM  I have reviewed the triage vital signs and the nursing notes.   HISTORY  Chief Complaint Abdominal Pain    HPI Margaret Stephens is a 85 y.o. female with a history of high cholesterol, osteoporosis, vertigo who reports low back pain radiating to bilateral lower abdomen for the past 2 days.  Gradual onset, constant, worsening.  Worse with movement, no alleviating factors.  Denies constipation diarrhea or vomiting.  No fevers chills or body aches or fatigue.  Eating and drinking normally.  No lower extremity weakness or paresthesia.  She notes that 3 days ago her toilet started running and due to an overhanging shelf, she has been leaning far forward over the toilet to lift the heavy lid off of the tank and mess with the float and stopper.      Past Medical History:  Diagnosis Date  . Anxiety   . High cholesterol   . Osteoporosis   . Vertigo      Patient Active Problem List   Diagnosis Date Noted  . Hypoxia 09/15/2017  . Dizziness 05/21/2017     Past Surgical History:  Procedure Laterality Date  . KNEE ARTHROSCOPY Left   . RECTAL SURGERY       Prior to Admission medications   Medication Sig Start Date End Date Taking? Authorizing Provider  aspirin EC 81 MG tablet Take 81 mg by mouth every evening.    Yes [provider]  cholecalciferol (VITAMIN D3) 25 MCG (1000 UT) tablet Take 1,000 Units by mouth daily.   Yes [provider]  LORazepam (ATIVAN) 0.5 MG tablet Take 0.5 mg by mouth 2 (two) times daily.    Yes [provider]  meclizine (ANTIVERT) 12.5 MG tablet Take 1 tablet (12.5 mg total) by mouth 3 (three) times daily. Patient taking differently: Take 12.5 mg by mouth 3 (three) times daily as needed for dizziness.  05/23/17  Yes Enid BaasKalisetti, Radhika, MD  simvastatin (ZOCOR) 40 MG tablet Take 40  mg by mouth every evening.    Yes [provider]  oxyCODONE-acetaminophen (PERCOCET) 5-325 MG tablet Take 1 tablet by mouth every 6 (six) hours as needed for up to 3 days for severe pain. 04/22/19 04/25/19  Sharman CheekStafford, Lile Mccurley, MD     Allergies Patient has no known allergies.   No family history on file.  Social History Social History   Tobacco Use  . Smoking status: Never Smoker  . Smokeless tobacco: Never Used  Substance Use Topics  . Alcohol use: No  . Drug use: No    Review of Systems  Constitutional:   No fever or chills.  ENT:   No sore throat. No rhinorrhea. Cardiovascular:   No chest pain or syncope. Respiratory:   No dyspnea or cough. Gastrointestinal:   Positive as above for abdominal pain without vomiting and diarrhea.  Musculoskeletal: Positive as above low back pain All other systems reviewed and are negative except as documented above in ROS and HPI.  ____________________________________________   PHYSICAL EXAM:  VITAL SIGNS: ED Triage Vitals  Enc Vitals Group     BP 04/22/19 1100 (!) 148/80     Pulse Rate 04/22/19 1100 95     Resp 04/22/19 1100 16     Temp 04/22/19 1100 98.1 F (36.7 C)     Temp Source 04/22/19 1100 Oral     SpO2 04/22/19 1100 95 %  Weight 04/22/19 1101 130 lb (59 kg)     Height 04/22/19 1101 4\' 11"  (1.499 m)     Head Circumference --      Peak Flow --      Pain Score 04/22/19 1101 8     Pain Loc --      Pain Edu? --      Excl. in [place]? --     Vital signs reviewed, nursing assessments reviewed.   Constitutional:   Alert and oriented. Non-toxic appearance. Eyes:   Conjunctivae are normal. EOMI. PERRL. ENT      Head:   Normocephalic and atraumatic.      Nose:   No congestion/rhinnorhea.       Mouth/Throat:   MMM, no pharyngeal erythema. No peritonsillar mass.       Neck:   No meningismus. Full ROM. Hematological/Lymphatic/Immunilogical:   No cervical lymphadenopathy. Cardiovascular:   RRR. Symmetric bilateral  radial and DP pulses.  No murmurs. Cap refill less than 2 seconds. Respiratory:   Normal respiratory effort without tachypnea/retractions. Breath sounds are clear and equal bilaterally. No wheezes/rales/rhonchi. Gastrointestinal:   Soft with diffuse lower abdominal tenderness, mild. Non distended. There is no CVA tenderness.  No rebound, rigidity, or guarding.  Musculoskeletal: Positive tenderness in bilateral lower back.  normal range of motion in all extremities. No joint effusions.  No lower extremity tenderness.  No edema. Neurologic:   Normal speech and language.  Motor grossly intact. No acute focal neurologic deficits are appreciated.  Skin:    Skin is warm, dry and intact. No rash noted.  No petechiae, purpura, or bullae.  ____________________________________________    LABS (pertinent positives/negatives) (all labs ordered are listed, but only abnormal results are displayed) Labs Reviewed  COMPREHENSIVE METABOLIC PANEL - Abnormal; Notable for the following components:      Result Value   Glucose, Bld 132 (*)    All other components within normal limits  SARS CORONAVIRUS 2 (TAT 6-24 HRS)  LIPASE, BLOOD  CBC  URINALYSIS, COMPLETE (UACMP) WITH MICROSCOPIC   ____________________________________________   EKG    ____________________________________________    RADIOLOGY  Ct Abdomen Pelvis W Contrast  Result Date: 04/22/2019 CLINICAL DATA:  Abdominal pain. EXAM: CT ABDOMEN AND PELVIS WITH CONTRAST TECHNIQUE: Multidetector CT imaging of the abdomen and pelvis was performed using the standard protocol following bolus administration of intravenous contrast. CONTRAST:  57mL OMNIPAQUE IOHEXOL 300 MG/ML  SOLN COMPARISON:  CT abdomen pelvis dated May 21, 2017. FINDINGS: Lower chest: No acute abnormality. Unchanged scarring in the left lower lobe. Unchanged large hiatal hernia. Hepatobiliary: No focal liver abnormality is seen. No gallstones, gallbladder wall thickening, or  biliary dilatation. Pancreas: Unremarkable. No pancreatic ductal dilatation or surrounding inflammatory changes. Spleen: Normal in size without focal abnormality. Adrenals/Urinary Tract: The adrenal glands are unremarkable. Unchanged 5 mm right renal calculus. New punctate left renal calculus in the lower pole. No hydronephrosis. Mild anterior bladder wall thickening, favored related to underdistention of this area. Stomach/Bowel: Hiatal hernia, as above. No bowel wall thickening, distention, or surrounding inflammatory changes. Normal appendix (series 5, image 50). Vascular/Lymphatic: Aortic atherosclerosis. No enlarged abdominal or pelvic lymph nodes. Reproductive: Uterus and bilateral adnexa are unremarkable. Other: No free fluid or pneumoperitoneum. Musculoskeletal: New acute mild L1 superior endplate compression fracture. No retropulsion. IMPRESSION: 1. New acute mild L1 superior endplate compression fracture. 2. Unchanged large hiatal hernia. 3. Bilateral nephrolithiasis. 4.  Aortic atherosclerosis (ICD10-I70.0). Electronically Signed   By: Titus Dubin M.D.   On: 04/22/2019  13:02    ____________________________________________   PROCEDURES Procedures  ____________________________________________  DIFFERENTIAL DIAGNOSIS   Small bowel obstruction, diverticulitis, appendicitis, bowel perforation, intra-abdominal abscess, lumbar fracture  CLINICAL IMPRESSION / ASSESSMENT AND PLAN / ED COURSE  Medications ordered in the ED: Medications  sodium chloride flush (NS) 0.9 % injection 3 mL (3 mLs Intravenous Given 04/22/19 1143)  fentaNYL (SUBLIMAZE) injection 50 mcg (50 mcg Intravenous Given 04/22/19 1141)  iohexol (OMNIPAQUE) 300 MG/ML solution 75 mL (75 mLs Intravenous Contrast Given 04/22/19 1238)  HYDROmorphone (DILAUDID) injection 0.5 mg (0.5 mg Intravenous Given 04/22/19 1352)  ondansetron (ZOFRAN) injection 4 mg (4 mg Intravenous Given 04/22/19 1352)    Pertinent labs & imaging results  that were available during my care of the patient were reviewed by me and considered in my medical decision making (see chart for details).  Margaret Stephens was evaluated in Emergency Department on 04/22/2019 for the symptoms described in the history of present illness. She was evaluated in the context of the global COVID-19 pandemic, which necessitated consideration that the patient might be at risk for infection with the SARS-CoV-2 virus that causes COVID-19. Institutional protocols and algorithms that pertain to the evaluation of patients at risk for COVID-19 are in a state of rapid change based on information released by regulatory bodies including the CDC and federal and state organizations. These policies and algorithms were followed during the patient's care in the ED.   Patient presents with low back pain and low abdominal pain with some tenderness.  Vital signs are unremarkable, she is nontoxic.  Labs unremarkable.  CT obtained which shows no acute intra-abdominal pathology but there is a very mild L1 compression fracture.  She is neurologically intact.  After 50 mcg of IV fentanyl for initial pain control, patient was still having significant pain so I gave her 0.5 mg of Dilaudid IV.  Discussed with patient and her son at bedside regarding need for hospitalization or placement from the ED, but they are comfortable with her going home noting that there are lots of family members that live on either side of the patient's house, and they will be able to assist her at all times.    ----------------------------------------- 3:19 PM on 04/22/2019 ----------------------------------------- With attempts to adequately control her pain, patient is having recurrent vomiting and also hypoxia with her oxygen saturation dropping as low as 74% on room air.  She is maintaining on 4 L nasal cannula.  Unfortunately unable to provide adequate pain relief without severe side effects so she will need to be hospitalized  for further pain control and PT evaluation here in the hospital.  Discussed with Dr. Enid Baas.      ____________________________________________   FINAL CLINICAL IMPRESSION(S) / ED DIAGNOSES    Final diagnoses:  Compression fracture of L1 vertebra, initial encounter (HCC)  Generalized abdominal pain  Intractable back pain     ED Discharge Orders         Ordered    oxyCODONE-acetaminophen (PERCOCET) 5-325 MG tablet  Every 6 hours PRN     04/22/19 1418          Portions of this note were generated with dragon dictation software. Dictation errors may occur despite best attempts at proofreading.   Sharman Cheek, MD 04/22/19 1422    Sharman Cheek, MD 04/22/19 [phone]

## 2019-04-22 NOTE — ED Triage Notes (Signed)
Pt to ED via POV c/o abdominal pain, lower back, and hips. Pt states that the pain started 2 days ago and got worse last night. Pt is in NAD at this time. Pt denies vomiting or diarrhea but has had some nausea. Pt is in NAD.

## 2019-04-22 NOTE — ED Notes (Signed)
Attempted report nurse will call back 

## 2019-04-22 NOTE — Progress Notes (Signed)
Care Alignment Note  Advanced Directives Documents (Living Will, Power of Attorney) currently in the EHR documents reviewed.  Has the patient discussed their wishes with their family/healthcare power of attorney yes. How much does the family or healthcare power of attorney know about their wishes. Patient son understands patient's clinical condition with lumbar compression fracture.  Patient with hypoxia due to narcotics.  He agrees with patient's wishes to be DNR/DNI.  What does the patient/decision maker understand about their medical condition and the natural course of their disease.  Lumbar spine compression fracture.  Osteoporosis.  Hyperlipidemia.  Hypoxia secondary to narcotics.  What is the patient/decision maker's biggest fear or concern for the future pain and suffering   What is the most important goal for this patient should their health condition worsen maintenance of function.  Current   Code Status: DNR  Current code status has been reviewed/updated.  Time spent:18 minutes

## 2019-04-23 LAB — BASIC METABOLIC PANEL
Anion gap: 7 (ref 5–15)
BUN: 10 mg/dL (ref 8–23)
CO2: 29 mmol/L (ref 22–32)
Calcium: 9.3 mg/dL (ref 8.9–10.3)
Chloride: 103 mmol/L (ref 98–111)
Creatinine, Ser: 0.43 mg/dL — ABNORMAL LOW (ref 0.44–1.00)
GFR calc Af Amer: >60 mL/min (ref >60)
GFR calc non Af Amer: >60 mL/min (ref >60)
Glucose, Bld: 123 mg/dL — ABNORMAL HIGH (ref 70–99)
Potassium: 4.3 mmol/L (ref 3.5–5.1)
Sodium: 139 mmol/L (ref 135–145)

## 2019-04-23 LAB — CBC
HCT: 38.3 % (ref 36.0–46.0)
Hemoglobin: 12.1 g/dL (ref 12.0–15.0)
MCH: 29.6 pg (ref 26.0–34.0)
MCHC: 31.6 g/dL (ref 30.0–36.0)
MCV: 93.6 fL (ref 80.0–100.0)
Platelets: 162 10*3/uL (ref 150–400)
RBC: 4.09 MIL/uL (ref 3.87–5.11)
RDW: 13.2 % (ref 11.5–15.5)
WBC: 7.1 10*3/uL (ref 4.0–10.5)
nRBC: 0 % (ref 0.0–0.2)

## 2019-04-23 LAB — SARS CORONAVIRUS 2 (TAT 6-24 HRS): SARS Coronavirus 2: NEGATIVE

## 2019-04-23 LAB — MAGNESIUM: Magnesium: 1.9 mg/dL (ref 1.7–2.4)

## 2019-04-23 MED ORDER — LIDOCAINE 5 % EX PTCH
1.0000 | MEDICATED_PATCH | CUTANEOUS | Status: DC
  Administered 2019-04-23 – 2019-04-25 (×3): 1 via TRANSDERMAL
  Filled 2019-04-23 (×3): qty 1

## 2019-04-23 MED ORDER — ACETAMINOPHEN 325 MG PO TABS
650.0000 mg | ORAL_TABLET | Freq: Four times a day (QID) | ORAL | Status: DC | PRN
  Administered 2019-04-24: 21:00:00 650 mg via ORAL
  Filled 2019-04-23: qty 2

## 2019-04-23 MED ORDER — OXYCODONE-ACETAMINOPHEN 5-325 MG PO TABS: 1.0000 | ORAL_TABLET | ORAL | Status: DC | PRN

## 2019-04-23 MED ORDER — TRAMADOL HCL 50 MG PO TABS
50.0000 mg | ORAL_TABLET | Freq: Four times a day (QID) | ORAL | Status: DC | PRN
  Administered 2019-04-23 – 2019-04-25 (×3): 50 mg via ORAL
  Filled 2019-04-23 (×3): qty 1

## 2019-04-23 NOTE — Consult Note (Signed)
ORTHOPAEDIC CONSULTATION  PATIENT NAME: Margaret Stephens DOB: 1924/10/25  MRN: [id number]  REQUESTING PHYSICIAN: Enedina FinnerPatel, Sona, MD  Chief Complaint: Low back pain  HPI: Margaret Amosnnie M Raggio is a 85 y.o. female who complains of a 2 to 3-day history of progressive low back pain.  She had apparently been doing some work leaning forward over her toilet and had the gradual onset of pain to the lower back and "hips".  She denies any numbness or weakness.  She denied any fall or acute trauma.  She denied any other injuries.  The pain had progressed such that she had difficulty with sitting up or standing due to the intense lower back pain.  Past Medical History:  Diagnosis Date  . Anxiety   . High cholesterol   . Osteoporosis   . Vertigo    Past Surgical History:  Procedure Laterality Date  . KNEE ARTHROSCOPY Left   . RECTAL SURGERY     Social History   Socioeconomic History  . Marital status: Widowed    Spouse name: Not on file  . Number of children: Not on file  . Years of education: Not on file  . Highest education level: Not on file  Occupational History  . Not on file  Social Needs  . Financial resource strain: Not on file  . Food insecurity    Worry: Not on file    Inability: Not on file  . Transportation needs    Medical: Not on file    Non-medical: Not on file  Tobacco Use  . Smoking status: Never Smoker  . Smokeless tobacco: Never Used  Substance and Sexual Activity  . Alcohol use: No  . Drug use: No  . Sexual activity: Not on file  Lifestyle  . Physical activity    Days per week: Not on file    Minutes per session: Not on file  . Stress: Not on file  Relationships  . Social Musicianconnections    Talks on phone: Not on file    Gets together: Not on file    Attends religious service: Not on file    Active member of club or organization: Not on file    Attends meetings of clubs or organizations: Not on file    Relationship status: Not on file  Other Topics Concern  . Not on  file  Social History Narrative  . Not on file   No family history on file. No Known Allergies Prior to Admission medications   Medication Sig Start Date End Date Taking? Authorizing Provider  aspirin EC 81 MG tablet Take 81 mg by mouth every evening.    Yes [provider]  cholecalciferol (VITAMIN D3) 25 MCG (1000 UT) tablet Take 1,000 Units by mouth daily.   Yes [provider]  LORazepam (ATIVAN) 0.5 MG tablet Take 0.5 mg by mouth 2 (two) times daily.    Yes [provider]  meclizine (ANTIVERT) 12.5 MG tablet Take 1 tablet (12.5 mg total) by mouth 3 (three) times daily. Patient taking differently: Take 12.5 mg by mouth 3 (three) times daily as needed for dizziness.  05/23/17  Yes Enid BaasKalisetti, Radhika, MD  simvastatin (ZOCOR) 40 MG tablet Take 40 mg by mouth every evening.    Yes [provider]  oxyCODONE-acetaminophen (PERCOCET) 5-325 MG tablet Take 1 tablet by mouth every 6 (six) hours as needed for up to 3 days for severe pain. 04/22/19 04/25/19  Sharman CheekStafford, Phillip, MD   Dg Chest 1 View  Result  Date: 04/22/2019 CLINICAL DATA:  Shortness of breath EXAM: CHEST  1 VIEW COMPARISON:  09/16/2017 FINDINGS: Heart size remains mildly enlarged. Aorta is calcified and tortuous. There is a large hiatal hernia. Coarsened interstitial markings bilaterally. No focal airspace consolidation identified. No large pleural fluid collection. No pneumothorax. IMPRESSION: 1. Chronic interstitial changes without superimposed airspace opacity. 2. Large hiatal hernia. Electronically Signed   By: Duanne Guess M.D.   On: 04/22/2019 16:12   Mr Lumbar Spine Wo Contrast  Result Date: 04/22/2019 CLINICAL DATA:  Low back pain.  L1 compression fracture EXAM: MRI LUMBAR SPINE WITHOUT CONTRAST TECHNIQUE: Multiplanar, multisequence MR imaging of the lumbar spine was performed. No intravenous contrast was administered. COMPARISON:  CT 04/22/2019 FINDINGS: Segmentation:  Standard. Alignment:   Physiologic. Vertebrae: Acute superior endplate compression fracture of the L1 vertebral body with approximately 25% vertebral body height loss. No bony retropulsion. Fracture does not extend to involve the posterior elements. Remaining vertebral body heights are maintained without fracture. No bone lesion. No evidence of discitis. Conus medullaris and cauda equina: Conus extends to the L1 level. Conus and cauda equina appear normal. Paraspinal and other soft tissues: Negative. Disc levels: T12-L1: No significant disc protrusion, foraminal stenosis, or canal stenosis. L1-L2: No significant disc protrusion, foraminal stenosis, or canal stenosis. L2-L3: Minimal diffuse disc bulge and mild bilateral facet arthrosis without foraminal or canal stenosis. L3-L4: Diffuse disc bulge, bilateral facet arthrosis, and ligamentum flavum buckling result in mild right foraminal stenosis and mild-to-moderate canal stenosis. L4-L5: Mild diffuse disc bulge with bilateral facet arthrosis and prominent ligamentum flavum buckling result in moderate to severe canal stenosis without foraminal stenosis. L5-S1: Small central disc protrusion with bilateral facet arthrosis and ligamentum flavum buckling resulting in moderate canal stenosis without foraminal stenosis. IMPRESSION: 1. Acute superior endplate compression fracture of L1 with approximately 25% vertebral body height loss. 2. Multilevel lumbar spondylosis, most severe at the L4-5 level where there is moderate-to-severe canal stenosis. Electronically Signed   By: Duanne Guess M.D.   On: 04/22/2019 18:03   Ct Abdomen Pelvis W Contrast  Result Date: 04/22/2019 CLINICAL DATA:  Abdominal pain. EXAM: CT ABDOMEN AND PELVIS WITH CONTRAST TECHNIQUE: Multidetector CT imaging of the abdomen and pelvis was performed using the standard protocol following bolus administration of intravenous contrast. CONTRAST:  12mL OMNIPAQUE IOHEXOL 300 MG/ML  SOLN COMPARISON:  CT abdomen pelvis dated  May 21, 2017. FINDINGS: Lower chest: No acute abnormality. Unchanged scarring in the left lower lobe. Unchanged large hiatal hernia. Hepatobiliary: No focal liver abnormality is seen. No gallstones, gallbladder wall thickening, or biliary dilatation. Pancreas: Unremarkable. No pancreatic ductal dilatation or surrounding inflammatory changes. Spleen: Normal in size without focal abnormality. Adrenals/Urinary Tract: The adrenal glands are unremarkable. Unchanged 5 mm right renal calculus. New punctate left renal calculus in the lower pole. No hydronephrosis. Mild anterior bladder wall thickening, favored related to underdistention of this area. Stomach/Bowel: Hiatal hernia, as above. No bowel wall thickening, distention, or surrounding inflammatory changes. Normal appendix (series 5, image 50). Vascular/Lymphatic: Aortic atherosclerosis. No enlarged abdominal or pelvic lymph nodes. Reproductive: Uterus and bilateral adnexa are unremarkable. Other: No free fluid or pneumoperitoneum. Musculoskeletal: New acute mild L1 superior endplate compression fracture. No retropulsion. IMPRESSION: 1. New acute mild L1 superior endplate compression fracture. 2. Unchanged large hiatal hernia. 3. Bilateral nephrolithiasis. 4.  Aortic atherosclerosis (ICD10-I70.0). Electronically Signed   By: Obie Dredge M.D.   On: 04/22/2019 13:02    Positive ROS: All other systems have been reviewed and were  otherwise negative with the exception of those mentioned in the HPI and as above.  Physical Exam: General: Well developed, well nourished female seen in no acute distress. HEENT: Atraumatic and normocephalic. Sclera are clear. Extraocular motion is intact. Oropharynx is clear with moist mucosa.  Poor dentition. Neck: Supple, nontender, good range of motion.  Lungs: Clear to auscultation bilaterally. Cardiovascular: Regular rate and rhythm with normal S1 and S2. No murmurs. No gallops or rubs. Pedal pulses are palpable  bilaterally. Homans test is negative bilaterally. No significant pretibial or ankle edema. Abdomen: Soft, nontender, and nondistended.  Skin: No lesions in the area of chief complaint Neurologic: Awake, alert, and oriented. Sensory function is grossly intact. Motor strength is felt to be 5 over 5 bilaterally. No clonus or tremor. Good motor coordination. Lymphatic: No axillary or cervical lymphadenopathy  MUSCULOSKELETAL: Examination of the spine demonstrates tenderness to palpation along the lumbar region.  There is some paraspinous spasm.  There is significant increase in pain with attempts at sitting up.  Straight leg raise is negative.  Assessment: L1 vertebral compression fracture  Plan: The findings were discussed with the patient. I believe the patient may be a candidate for kyphoplasty.  I briefly discussed the option with the patient and she expressed an interest in exploring this option.  I will forward the information to Dr. Rudene Christians (who apparently did her knee surgery in the past) for his review and recommendations.  Keerthi Hazell P. Holley Bouche M.D.

## 2019-04-23 NOTE — Progress Notes (Signed)
[facility] - [place] [facility]   PATIENT NAME: Margaret Stephens    MR#:  [id number]  DATE OF BIRTH:  [birth year]  SUBJECTIVE:   Patient came in with acute on chronic onset back pain. She was found with compression fracture. States her pain is okay unless she tries to move. Seen by orthopedic surgeon Dr. Lutricia HorsfallHooton earlier REVIEW OF SYSTEMS:   Review of Systems  Constitutional: Negative for chills, fever and weight loss.  HENT: Negative for ear discharge, ear pain and nosebleeds.   Eyes: Negative for blurred vision, pain and discharge.  Respiratory: Negative for sputum production, shortness of breath, wheezing and stridor.   Cardiovascular: Negative for chest pain, palpitations, orthopnea and PND.  Gastrointestinal: Negative for abdominal pain, diarrhea, nausea and vomiting.  Genitourinary: Negative for frequency and urgency.  Musculoskeletal: Positive for back pain. Negative for joint pain.  Neurological: Negative for sensory change, speech change, focal weakness and weakness.  Psychiatric/Behavioral: Negative for depression and hallucinations. The patient is not nervous/anxious.    Tolerating Diet:yes Tolerating PT: pending  DRUG ALLERGIES:  No Known Allergies  VITALS:  Blood pressure 135/64, pulse 70, temperature 98.1 F (36.7 C), temperature source Oral, resp. rate 16, height 4\' 11"  (1.499 m), weight 59 kg, SpO2 100 %.  PHYSICAL EXAMINATION:   Physical Exam  GENERAL:  85 y.o.-year-old patient lying in the bed with no acute distress.  EYES: Pupils equal, round, reactive to light and accommodation. No scleral icterus. Extraocular muscles intact.  HEENT: Head atraumatic, normocephalic. Oropharynx and nasopharynx clear.  NECK:  Supple, no jugular venous distention. No thyroid enlargement, no tenderness.  LUNGS: Normal breath sounds bilaterally, no wheezing, rales, rhonchi. No use of accessory muscles of respiration.  CARDIOVASCULAR: S1, S2 normal. No  murmurs, rubs, or gallops.  ABDOMEN: Soft, nontender, nondistended. Bowel sounds present. No organomegaly or mass.  EXTREMITIES: No cyanosis, clubbing or edema b/l.    NEUROLOGIC: Cranial nerves II through XII are intact. No focal Motor or sensory deficits b/l.   PSYCHIATRIC:  patient is alert and oriented x 3.  SKIN: No obvious rash, lesion, or ulcer.   LABORATORY PANEL:  CBC Recent Labs  Lab 04/23/19 0407  WBC 7.1  HGB 12.1  HCT 38.3  PLT 162    Chemistries  Recent Labs  Lab 04/22/19 1140 04/23/19 0407  NA 139 139  K 4.2 4.3  CL 100 103  CO2 29 29  GLUCOSE 132* 123*  BUN 9 10  CREATININE 0.60 0.43*  CALCIUM 9.5 9.3  MG  --  1.9  AST 21  --   ALT 13  --   ALKPHOS 64  --   BILITOT 0.9  --    Cardiac Enzymes No results for input(s): TROPONINI in the last 168 hours. RADIOLOGY:  Dg Chest 1 View  Result Date: 04/22/2019 CLINICAL DATA:  Shortness of breath EXAM: CHEST  1 VIEW COMPARISON:  09/16/2017 FINDINGS: Heart size remains mildly enlarged. Aorta is calcified and tortuous. There is a large hiatal hernia. Coarsened interstitial markings bilaterally. No focal airspace consolidation identified. No large pleural fluid collection. No pneumothorax. IMPRESSION: 1. Chronic interstitial changes without superimposed airspace opacity. 2. Large hiatal hernia. Electronically Signed   By: Duanne GuessNicholas  Plundo M.D.   On: 04/22/2019 16:12   Mr Lumbar Spine Wo Contrast  Result Date: 04/22/2019 CLINICAL DATA:  Low back pain.  L1 compression fracture EXAM: MRI LUMBAR SPINE WITHOUT CONTRAST TECHNIQUE: Multiplanar, multisequence MR imaging of the lumbar spine was performed. No  intravenous contrast was administered. COMPARISON:  CT 04/22/2019 FINDINGS: Segmentation:  Standard. Alignment:  Physiologic. Vertebrae: Acute superior endplate compression fracture of the L1 vertebral body with approximately 25% vertebral body height loss. No bony retropulsion. Fracture does not extend to involve the  posterior elements. Remaining vertebral body heights are maintained without fracture. No bone lesion. No evidence of discitis. Conus medullaris and cauda equina: Conus extends to the L1 level. Conus and cauda equina appear normal. Paraspinal and other soft tissues: Negative. Disc levels: T12-L1: No significant disc protrusion, foraminal stenosis, or canal stenosis. L1-L2: No significant disc protrusion, foraminal stenosis, or canal stenosis. L2-L3: Minimal diffuse disc bulge and mild bilateral facet arthrosis without foraminal or canal stenosis. L3-L4: Diffuse disc bulge, bilateral facet arthrosis, and ligamentum flavum buckling result in mild right foraminal stenosis and mild-to-moderate canal stenosis. L4-L5: Mild diffuse disc bulge with bilateral facet arthrosis and prominent ligamentum flavum buckling result in moderate to severe canal stenosis without foraminal stenosis. L5-S1: Small central disc protrusion with bilateral facet arthrosis and ligamentum flavum buckling resulting in moderate canal stenosis without foraminal stenosis. IMPRESSION: 1. Acute superior endplate compression fracture of L1 with approximately 25% vertebral body height loss. 2. Multilevel lumbar spondylosis, most severe at the L4-5 level where there is moderate-to-severe canal stenosis. Electronically Signed   By: Davina Poke M.D.   On: 04/22/2019 18:03   Ct Abdomen Pelvis W Contrast  Result Date: 04/22/2019 CLINICAL DATA:  Abdominal pain. EXAM: CT ABDOMEN AND PELVIS WITH CONTRAST TECHNIQUE: Multidetector CT imaging of the abdomen and pelvis was performed using the standard protocol following bolus administration of intravenous contrast. CONTRAST:  57mL OMNIPAQUE IOHEXOL 300 MG/ML  SOLN COMPARISON:  CT abdomen pelvis dated May 21, 2017. FINDINGS: Lower chest: No acute abnormality. Unchanged scarring in the left lower lobe. Unchanged large hiatal hernia. Hepatobiliary: No focal liver abnormality is seen. No gallstones,  gallbladder wall thickening, or biliary dilatation. Pancreas: Unremarkable. No pancreatic ductal dilatation or surrounding inflammatory changes. Spleen: Normal in size without focal abnormality. Adrenals/Urinary Tract: The adrenal glands are unremarkable. Unchanged 5 mm right renal calculus. New punctate left renal calculus in the lower pole. No hydronephrosis. Mild anterior bladder wall thickening, favored related to underdistention of this area. Stomach/Bowel: Hiatal hernia, as above. No bowel wall thickening, distention, or surrounding inflammatory changes. Normal appendix (series 5, image 50). Vascular/Lymphatic: Aortic atherosclerosis. No enlarged abdominal or pelvic lymph nodes. Reproductive: Uterus and bilateral adnexa are unremarkable. Other: No free fluid or pneumoperitoneum. Musculoskeletal: New acute mild L1 superior endplate compression fracture. No retropulsion. IMPRESSION: 1. New acute mild L1 superior endplate compression fracture. 2. Unchanged large hiatal hernia. 3. Bilateral nephrolithiasis. 4.  Aortic atherosclerosis (ICD10-I70.0). Electronically Signed   By: Titus Dubin M.D.   On: 04/22/2019 13:02   ASSESSMENT AND PLAN:  Patient is a 85 year old female with history of hyperlipidemia and osteoporosis who presented to the emergency room with complaints of back pain following doing some work in her toilet about 3 days ago.  1.  Compression fracture of the L1 superior endplate -No fall or trauma.  - Symptoms started following patient doing some walk  about 3 days ago.  -still able to ambulate but with significant pains. --Pain control with p.o. Percocet or ultram as needed -appreciate Dr. Scarlett Presto input. Plans for documents to see if patient is appropriate candidate for [facility] plasty. Will keep him NPO after midnight in case documents is able to do procedure tomorrow. -Physical therapy to see after procedure  2.  Hypoxia -Oxygen  saturation was 95% on room air on presentation.   Patient became hypoxic following administration of narcotics. -Patient stable. Wean to room air.  3.  Hyperlipidemia Continue statins.  4. DVT prophylaxis; SCDs for now in anticipation for kyphoplasty  Pt's family has been updated by RN  Case discussed with Care Management/Social Worker. Management plans discussed with the patient, fand they are in agreement.  CODE STATUS: DNR  DVT Prophylaxis:SCD  TOTAL TIME TAKING CARE OF THIS PATIENT: *30* minutes.  >50% time spent on counselling and coordination of care  POSSIBLE D/C IN *2-3 DAYS, DEPENDING ON CLINICAL CONDITION.  Note: This dictation was prepared with Dragon dictation along with smaller phrase technology. Any transcriptional errors that result from this process are unintentional.  Enedina Finner M.D on 04/23/2019 at 1:22 PM  Between 7am to 6pm - Pager - [phone]  After 6pm go to [email or web address] - Social research officer, government  [facility]  Office  [phone]  CC: Primary care physician; Danella Penton, MDPatient ID: Margaret Stephens, female   DOB: [birth year], 85 y.o.   MRN: [id number]

## 2019-04-23 NOTE — Progress Notes (Signed)
Pts O2 87% on room air, 2L oxygen placed on pt, Pt now 96%, MD Patel notified.

## 2019-04-24 ENCOUNTER — Encounter: Payer: Self-pay | Admitting: Anesthesiology

## 2019-04-24 ENCOUNTER — Inpatient Hospital Stay: Payer: Medicare Other

## 2019-04-24 ENCOUNTER — Other Ambulatory Visit: Payer: Self-pay

## 2019-04-24 ENCOUNTER — Encounter
Admission: EM | Disposition: A | Discharge status: Home Health | Payer: Self-pay | Source: Home / Self Care | Attending: Internal Medicine

## 2019-04-24 ENCOUNTER — Inpatient Hospital Stay: Payer: Medicare Other | Admitting: Anesthesiology

## 2019-04-24 HISTORY — PX: KYPHOPLASTY: SHX5884

## 2019-04-24 LAB — CBC
HCT: 41.2 % (ref 36.0–46.0)
Hemoglobin: 12.9 g/dL (ref 12.0–15.0)
MCH: 29.5 pg (ref 26.0–34.0)
MCHC: 31.3 g/dL (ref 30.0–36.0)
MCV: 94.3 fL (ref 80.0–100.0)
Platelets: 162 10*3/uL (ref 150–400)
RBC: 4.37 MIL/uL (ref 3.87–5.11)
RDW: 13.2 % (ref 11.5–15.5)
WBC: 10.6 10*3/uL — ABNORMAL HIGH (ref 4.0–10.5)
nRBC: 0 % (ref 0.0–0.2)

## 2019-04-24 LAB — CREATININE, SERUM
Creatinine, Ser: 0.51 mg/dL (ref 0.44–1.00)
GFR calc Af Amer: >60 mL/min (ref >60)
GFR calc non Af Amer: >60 mL/min (ref >60)

## 2019-04-24 SURGERY — KYPHOPLASTY
Anesthesia: General | Site: Back

## 2019-04-24 MED ORDER — MAGNESIUM HYDROXIDE 400 MG/5ML PO SUSP
30.0000 mL | Freq: Every day | ORAL | Status: DC | PRN
  Administered 2019-04-25: 06:00:00 30 mL via ORAL
  Filled 2019-04-24: qty 30

## 2019-04-24 MED ORDER — OXYCODONE HCL 5 MG/5ML PO SOLN: 5.0000 mg | Freq: Once | ORAL | Status: DC | PRN

## 2019-04-24 MED ORDER — METOCLOPRAMIDE HCL 10 MG PO TABS: 5.0000 mg | ORAL_TABLET | Freq: Three times a day (TID) | ORAL | Status: DC | PRN

## 2019-04-24 MED ORDER — SODIUM CHLORIDE 0.9 % IV SOLN
INTRAVENOUS | Status: DC | PRN
  Administered 2019-04-24: 15:00:00 via INTRAVENOUS

## 2019-04-24 MED ORDER — KETAMINE HCL 50 MG/ML IJ SOLN
INTRAMUSCULAR | Status: AC
  Filled 2019-04-24: qty 10

## 2019-04-24 MED ORDER — IOHEXOL 180 MG/ML  SOLN
INTRAMUSCULAR | Status: DC | PRN
  Administered 2019-04-24: 15:00:00 20 mL

## 2019-04-24 MED ORDER — MAGNESIUM CITRATE PO SOLN
1.0000 | Freq: Once | ORAL | Status: DC | PRN
  Filled 2019-04-24: qty 296

## 2019-04-24 MED ORDER — OXYCODONE HCL 5 MG PO TABS: 5.0000 mg | ORAL_TABLET | Freq: Once | ORAL | Status: DC | PRN

## 2019-04-24 MED ORDER — DOCUSATE SODIUM 100 MG PO CAPS
100.0000 mg | ORAL_CAPSULE | Freq: Two times a day (BID) | ORAL | Status: DC
  Administered 2019-04-24 – 2019-04-25 (×2): 100 mg via ORAL
  Filled 2019-04-24 (×2): qty 1

## 2019-04-24 MED ORDER — KETAMINE HCL 50 MG/ML IJ SOLN
INTRAMUSCULAR | Status: DC | PRN
  Administered 2019-04-24: 25 mg via INTRAMUSCULAR

## 2019-04-24 MED ORDER — METOCLOPRAMIDE HCL 5 MG/ML IJ SOLN: 5.0000 mg | Freq: Three times a day (TID) | INTRAMUSCULAR | Status: DC | PRN

## 2019-04-24 MED ORDER — FENTANYL CITRATE (PF) 100 MCG/2ML IJ SOLN: 25.0000 ug | INTRAMUSCULAR | Status: DC | PRN

## 2019-04-24 MED ORDER — CEFAZOLIN SODIUM-DEXTROSE 1-4 GM/50ML-% IV SOLN
1.0000 g | Freq: Once | INTRAVENOUS | Status: AC
  Administered 2019-04-24: 15:00:00 1 g via INTRAVENOUS
  Filled 2019-04-24: qty 50

## 2019-04-24 MED ORDER — PROPOFOL 10 MG/ML IV BOLUS
INTRAVENOUS | Status: AC
  Filled 2019-04-24: qty 20

## 2019-04-24 MED ORDER — PROPOFOL 500 MG/50ML IV EMUL
INTRAVENOUS | Status: DC | PRN
  Administered 2019-04-24: 25 ug/kg/min via INTRAVENOUS

## 2019-04-24 MED ORDER — ENOXAPARIN SODIUM 40 MG/0.4ML ~~LOC~~ SOLN
40.0000 mg | SUBCUTANEOUS | Status: DC
  Administered 2019-04-25: 40 mg via SUBCUTANEOUS
  Filled 2019-04-24: qty 0.4

## 2019-04-24 MED ORDER — LIDOCAINE HCL 1 % IJ SOLN
INTRAMUSCULAR | Status: DC | PRN
  Administered 2019-04-24: 10 mL
  Administered 2019-04-24: 15 mL

## 2019-04-24 MED ORDER — BISACODYL 5 MG PO TBEC: 5.0000 mg | DELAYED_RELEASE_TABLET | Freq: Every day | ORAL | Status: DC | PRN

## 2019-04-24 MED ORDER — BUPIVACAINE-EPINEPHRINE (PF) 0.5% -1:200000 IJ SOLN
INTRAMUSCULAR | Status: DC | PRN
  Administered 2019-04-24: 15 mL

## 2019-04-24 SURGICAL SUPPLY — 20 items

## 2019-04-24 NOTE — TOC Initial Note (Signed)
Transition of Care (TOC) - Initial/Assessment Note    Patient Details  Name: Margaret Stephens MRN: [id number] Date of Birth: 01/03/1925  Transition of Care (TOC) CM/SW Contact:    Deliliah J Gregory, RN Phone Number: 04/24/2019, 10:05 AM  Clinical Narrative:                 Met with the patient in the room to dicuss DC plan and needs She lives alone, her adult son lives next door and her grand daughter lives on the other side, she has a cane and a RW at home and feels she wont need anything else.  She is accepting to having HH PT if needed. She is to have Kypho today and then PT will see her and make recommendation Will continue to monitor for needs   Expected Discharge Plan: Home w Home Health Services Barriers to Discharge: Continued Medical Work up   Patient Goals and CMS Choice Patient states their goals for this hospitalization and ongoing recovery are:: go home      Expected Discharge Plan and Services Expected Discharge Plan: Home w Home Health Services   Discharge Planning Services: CM Consult   Living arrangements for the past 2 months: Skilled Nursing Facility                                      Prior Living Arrangements/Services Living arrangements for the past 2 months: Skilled Nursing Facility Lives with:: Self(Son lives next door, drand daughter lives on the other side) Patient language and need for interpreter reviewed:: Yes Do you feel safe going back to the place where you live?: Yes      Need for Family Participation in Patient Care: No (Comment) Care giver support system in place?: Yes (comment) Current home services: DME(RW and cane) Criminal Activity/Legal Involvement Pertinent to Current Situation/Hospitalization: No - Comment as needed  Activities of Daily Living      Permission Sought/Granted   Permission granted to share information with : Yes, Verbal Permission Granted              Emotional Assessment Appearance:: Appears stated  age Attitude/Demeanor/Rapport: Engaged Affect (typically observed): Accepting, Appropriate, Calm Orientation: : Oriented to Self, Oriented to Place, Oriented to  Time, Oriented to Situation Alcohol / Substance Use: Not Applicable Psych Involvement: No (comment)  Admission diagnosis:  Generalized abdominal pain [R10.84] Hypoxia [R09.02] Intractable back pain [M54.9] Compression fracture of L1 vertebra, initial encounter (HCC) [S32.010A] Patient Active Problem List   Diagnosis Date Noted  . Lumbar compression fracture (HCC) 04/22/2019  . Hypoxia 09/15/2017  . Dizziness 05/21/2017   PCP:  Miller, Mark F, MD Pharmacy:   [id number] - [place], [place] - [address] [place] [address] Phone: [phone] Fax: [phone]     Social Determinants of Health (SDOH) Interventions    Readmission Risk Interventions No flowsheet data found.  

## 2019-04-24 NOTE — Progress Notes (Signed)
Patient examined and found to have significant tenderness at L1 consistent with her MRI findings.  She is neurologically intact.  I talked with her son and discussed the risks, benefits, of surgery and will plan on surgery later today with L1 kyphoplasty.  Left shoulder marked.

## 2019-04-24 NOTE — Plan of Care (Signed)
  Problem: Education: Goal: Knowledge of General Education information will improve Description: Including pain rating scale, medication(s)/side effects and non-pharmacologic comfort measures Outcome: Progressing   Problem: Health Behavior/Discharge Planning: Goal: Ability to manage health-related needs will improve Outcome: Progressing   Problem: Clinical Measurements: Goal: Ability to maintain clinical measurements within normal limits will improve Outcome: Progressing Goal: Diagnostic test results will improve Outcome: Progressing Goal: Cardiovascular complication will be avoided Outcome: Progressing   Problem: Activity: Goal: Risk for activity intolerance will decrease Outcome: Progressing   Problem: Elimination: Goal: Will not experience complications related to bowel motility Outcome: Progressing Goal: Will not experience complications related to urinary retention Outcome: Progressing   Problem: Pain Managment: Goal: General experience of comfort will improve Outcome: Progressing   Problem: Safety: Goal: Ability to remain free from injury will improve Outcome: Progressing

## 2019-04-24 NOTE — Anesthesia Preprocedure Evaluation (Addendum)
Anesthesia Evaluation  Patient identified by MRN, date of birth, ID band Patient awake and Patient confused    Reviewed: Allergy & Precautions, H&P , NPO status , Patient's Chart, lab work & pertinent test results  History of Anesthesia Complications Negative for: history of anesthetic complications  Airway Mallampati: III  TM Distance: <3 FB Neck ROM: limited    Dental  (+) Chipped, Poor Dentition, Missing   Pulmonary neg pulmonary ROS, neg shortness of breath,           Cardiovascular Exercise Tolerance: Good (-) Past MI negative cardio ROS       Neuro/Psych PSYCHIATRIC DISORDERS negative neurological ROS     GI/Hepatic negative GI ROS, Neg liver ROS, neg GERD  ,  Endo/Other  negative endocrine ROS  Renal/GU negative Renal ROS  negative genitourinary   Musculoskeletal   Abdominal   Peds  Hematology negative hematology ROS (+)   Anesthesia Other Findings Past Medical History: No date: Anxiety No date: High cholesterol No date: Osteoporosis No date: Vertigo  Past Surgical History: No date: KNEE ARTHROSCOPY; Left No date: RECTAL SURGERY    Reproductive/Obstetrics negative OB ROS                            Anesthesia Physical Anesthesia Plan  ASA: III  Anesthesia Plan: General   Post-op Pain Management:    Induction: Intravenous  PONV Risk Score and Plan: Propofol infusion and TIVA  Airway Management Planned: Natural Airway and Nasal Cannula  Additional Equipment:   Intra-op Plan:   Post-operative Plan:   Informed Consent: I have reviewed the patients History and Physical, chart, labs and discussed the procedure including the risks, benefits and alternatives for the proposed anesthesia with the patient or authorized representative who has indicated his/her understanding and acceptance.   Patient has DNR.  Discussed DNR with patient, Discussed DNR with power of  attorney and Suspend DNR.   Dental Advisory Given  Plan Discussed with: Anesthesiologist, CRNA and Surgeon  Anesthesia Plan Comments: (Phone consent from son Klaire Court at [phone]   Son and patient consented for risks of anesthesia including but not limited to:  - adverse reactions to medications - risk of intubation if required - damage to teeth, lips or other oral mucosa - sore throat or hoarseness - Damage to heart, brain, lungs or loss of life  They voiced understanding.)       Anesthesia Quick Evaluation

## 2019-04-24 NOTE — Transfer of Care (Signed)
Immediate Anesthesia Transfer of Care Note  Patient: Margaret Stephens  Procedure(s) Performed: KYPHOPLASTY L1 (N/A Back)  Patient Location: PACU  Anesthesia Type:MAC  Level of Consciousness: awake  Airway & Oxygen Therapy: Patient connected to nasal cannula oxygen  Post-op Assessment: Post -op Vital signs reviewed and stable  Post vital signs: stable  Last Vitals:  Vitals Value Taken Time  BP 144/88 04/24/19 1541  Temp    Pulse 101 04/24/19 1544  Resp 15 04/24/19 1544  SpO2 98 % 04/24/19 1544  Vitals shown include unvalidated device data.  Last Pain:  Vitals:   04/24/19 1404  TempSrc: Temporal  PainSc: 0-No pain         Complications: No apparent anesthesia complications

## 2019-04-24 NOTE — Op Note (Signed)
Date 04/24/2019  time 3:40 PM   PATIENT:  Margaret Stephens   PRE-OPERATIVE DIAGNOSIS:  closed wedge compression fracture of L1   POST-OPERATIVE DIAGNOSIS:  closed wedge compression fracture of L1   PROCEDURE:  Procedure(s): KYPHOPLASTY L1  SURGEON: Laurene Footman, MD   ASSISTANTS: None   ANESTHESIA:   local and MAC   EBL:  No intake/output data recorded.   BLOOD ADMINISTERED:none   DRAINS: none    LOCAL MEDICATIONS USED:  MARCAINE    and XYLOCAINE    SPECIMEN:   None   DISPOSITION OF SPECIMEN:  Not applicable   COUNTS:  YES   TOURNIQUET:  * No tourniquets in log *   IMPLANTS: Bone cement   DICTATION: .Dragon Dictation  patient was brought to the operating room and after adequate anesthesia was obtained the patient was placed prone.  C arm was brought in in good visualization of the affected level obtained on both AP and lateral projections.  After patient identification and timeout procedures were completed, local anesthetic was infiltrated with 10 cc 1% Xylocaine infiltrated subcutaneously.  This is done the area on the right side of the planned approach.  The back was then prepped and draped in the usual sterile manner and repeat timeout procedure carried out.  A spinal needle was brought down to the pedicle on the right side of  L1 and a 50-50 mix of 1% Xylocaine half percent Sensorcaine with epinephrine total of 30 cc injected.  After allowing this to set a small incision was made and the trocar was advanced into the vertebral body in an extrapedicular fashion.  Biopsy was not obtained Drilling was carried out balloon inserted with inflation to  3 cc.  When the cement was appropriate consistency 4.5 cc were injected into the vertebral body without extravasation, good fill superior to inferior endplates and from right to left sides along the inferior endplate.  After the cement had set the trochar was removed and permanent C-arm views obtained.  The wound was closed with Dermabond  followed by Band-Aid   PLAN OF CARE:  Continue as inpatient   PATIENT DISPOSITION:  PACU - hemodynamically stable.

## 2019-04-24 NOTE — Progress Notes (Signed)
[facility] [place] NAME: Margaret Stephens    MR#:  [id number]  DATE OF BIRTH:  [birth year]  SUBJECTIVE:   Patient came in with acute on chronic onset back pain. She was found with compression fracture. States her pain is okay unless she tries to move. Seen by orthopedic surgeon Dr. Bess Harvest earlier REVIEW OF SYSTEMS:   Review of Systems  Constitutional: Negative for chills, fever and weight loss.  HENT: Negative for ear discharge, ear pain and nosebleeds.   Eyes: Negative for blurred vision, pain and discharge.  Respiratory: Negative for sputum production, shortness of breath, wheezing and stridor.   Cardiovascular: Negative for chest pain, palpitations, orthopnea and PND.  Gastrointestinal: Negative for abdominal pain, diarrhea, nausea and vomiting.  Genitourinary: Negative for frequency and urgency.  Musculoskeletal: Positive for back pain. Negative for joint pain.  Neurological: Negative for sensory change, speech change, focal weakness and weakness.  Psychiatric/Behavioral: Negative for depression and hallucinations. The patient is not nervous/anxious.    Tolerating Diet:yes Tolerating PT: pending  DRUG ALLERGIES:  No Known Allergies  VITALS:  Blood pressure 132/73, pulse 78, temperature 98.1 F (36.7 C), temperature source Oral, resp. rate 16, height 4\' 11"  (1.499 m), weight 59 kg, SpO2 95 %.  PHYSICAL EXAMINATION:   Physical Exam  GENERAL:  85 y.o.-year-old patient lying in the bed with no acute distress.  EYES: Pupils equal, round, reactive to light and accommodation. No scleral icterus. Extraocular muscles intact.  HEENT: Head atraumatic, normocephalic. Oropharynx and nasopharynx clear.  NECK:  Supple, no jugular venous distention. No thyroid enlargement, no tenderness.  LUNGS: Normal breath sounds bilaterally, no wheezing, rales, rhonchi. No use of accessory muscles of respiration.  CARDIOVASCULAR: S1, S2 normal. No  murmurs, rubs, or gallops.  ABDOMEN: Soft, nontender, nondistended. Bowel sounds present. No organomegaly or mass.  EXTREMITIES: No cyanosis, clubbing or edema b/l.    NEUROLOGIC: Cranial nerves II through XII are intact. No focal Motor or sensory deficits b/l.   PSYCHIATRIC:  patient is alert and oriented x 3.  SKIN: No obvious rash, lesion, or ulcer.   LABORATORY PANEL:  CBC Recent Labs  Lab 04/23/19 0407  WBC 7.1  HGB 12.1  HCT 38.3  PLT 162    Chemistries  Recent Labs  Lab 04/22/19 1140 04/23/19 0407  NA 139 139  K 4.2 4.3  CL 100 103  CO2 29 29  GLUCOSE 132* 123*  BUN 9 10  CREATININE 0.60 0.43*  CALCIUM 9.5 9.3  MG  --  1.9  AST 21  --   ALT 13  --   ALKPHOS 64  --   BILITOT 0.9  --    Cardiac Enzymes No results for input(s): TROPONINI in the last 168 hours. RADIOLOGY:  Dg Chest 1 View  Result Date: 04/22/2019 CLINICAL DATA:  Shortness of breath EXAM: CHEST  1 VIEW COMPARISON:  09/16/2017 FINDINGS: Heart size remains mildly enlarged. Aorta is calcified and tortuous. There is a large hiatal hernia. Coarsened interstitial markings bilaterally. No focal airspace consolidation identified. No large pleural fluid collection. No pneumothorax. IMPRESSION: 1. Chronic interstitial changes without superimposed airspace opacity. 2. Large hiatal hernia. Electronically Signed   By: Davina Poke M.D.   On: 04/22/2019 16:12   Mr Lumbar Spine Wo Contrast  Result Date: 04/22/2019 CLINICAL DATA:  Low back pain.  L1 compression fracture EXAM: MRI LUMBAR SPINE WITHOUT CONTRAST TECHNIQUE: Multiplanar, multisequence MR imaging of the lumbar spine was performed. No  intravenous contrast was administered. COMPARISON:  CT 04/22/2019 FINDINGS: Segmentation:  Standard. Alignment:  Physiologic. Vertebrae: Acute superior endplate compression fracture of the L1 vertebral body with approximately 25% vertebral body height loss. No bony retropulsion. Fracture does not extend to involve the  posterior elements. Remaining vertebral body heights are maintained without fracture. No bone lesion. No evidence of discitis. Conus medullaris and cauda equina: Conus extends to the L1 level. Conus and cauda equina appear normal. Paraspinal and other soft tissues: Negative. Disc levels: T12-L1: No significant disc protrusion, foraminal stenosis, or canal stenosis. L1-L2: No significant disc protrusion, foraminal stenosis, or canal stenosis. L2-L3: Minimal diffuse disc bulge and mild bilateral facet arthrosis without foraminal or canal stenosis. L3-L4: Diffuse disc bulge, bilateral facet arthrosis, and ligamentum flavum buckling result in mild right foraminal stenosis and mild-to-moderate canal stenosis. L4-L5: Mild diffuse disc bulge with bilateral facet arthrosis and prominent ligamentum flavum buckling result in moderate to severe canal stenosis without foraminal stenosis. L5-S1: Small central disc protrusion with bilateral facet arthrosis and ligamentum flavum buckling resulting in moderate canal stenosis without foraminal stenosis. IMPRESSION: 1. Acute superior endplate compression fracture of L1 with approximately 25% vertebral body height loss. 2. Multilevel lumbar spondylosis, most severe at the L4-5 level where there is moderate-to-severe canal stenosis. Electronically Signed   By: Duanne Guess M.D.   On: 04/22/2019 18:03   Ct Abdomen Pelvis W Contrast  Result Date: 04/22/2019 CLINICAL DATA:  Abdominal pain. EXAM: CT ABDOMEN AND PELVIS WITH CONTRAST TECHNIQUE: Multidetector CT imaging of the abdomen and pelvis was performed using the standard protocol following bolus administration of intravenous contrast. CONTRAST:  72mL OMNIPAQUE IOHEXOL 300 MG/ML  SOLN COMPARISON:  CT abdomen pelvis dated May 21, 2017. FINDINGS: Lower chest: No acute abnormality. Unchanged scarring in the left lower lobe. Unchanged large hiatal hernia. Hepatobiliary: No focal liver abnormality is seen. No gallstones,  gallbladder wall thickening, or biliary dilatation. Pancreas: Unremarkable. No pancreatic ductal dilatation or surrounding inflammatory changes. Spleen: Normal in size without focal abnormality. Adrenals/Urinary Tract: The adrenal glands are unremarkable. Unchanged 5 mm right renal calculus. New punctate left renal calculus in the lower pole. No hydronephrosis. Mild anterior bladder wall thickening, favored related to underdistention of this area. Stomach/Bowel: Hiatal hernia, as above. No bowel wall thickening, distention, or surrounding inflammatory changes. Normal appendix (series 5, image 50). Vascular/Lymphatic: Aortic atherosclerosis. No enlarged abdominal or pelvic lymph nodes. Reproductive: Uterus and bilateral adnexa are unremarkable. Other: No free fluid or pneumoperitoneum. Musculoskeletal: New acute mild L1 superior endplate compression fracture. No retropulsion. IMPRESSION: 1. New acute mild L1 superior endplate compression fracture. 2. Unchanged large hiatal hernia. 3. Bilateral nephrolithiasis. 4.  Aortic atherosclerosis (ICD10-I70.0). Electronically Signed   By: Obie Dredge M.D.   On: 04/22/2019 13:02   ASSESSMENT AND PLAN:  Patient is a 85 year old female with history of hyperlipidemia and osteoporosis who presented to the emergency room with complaints of back pain following doing some work in her toilet about 3 days ago.  1. Compression fracture of the L1 superior endplate 25 % height lass -No fall or trauma.  - Symptoms started following patient doing some walk  about 3 days ago.  -still able to ambulate but with significant pains. --Pain control with p.o. Percocet or ultram as needed -appreciate Dr. Elenor Legato input.  -Dr Rosita Kea to do Kyphoplasty -Physical therapy to see after procedure  2.  Hypoxia -Oxygen saturation was 95% on room air on presentation.  Patient became hypoxic following administration of narcotics. -Patient stable. Wean  to room air when able to  3.  Hyperlipidemia Continue statins.  4. DVT prophylaxis; SCDs for now in anticipation for kyphoplasty  Pt's family has been updated by Dr Rosita Kea  Case discussed with Care Management/Social Worker. Management plans discussed with the patient they are in agreement.  CODE STATUS: DNR  DVT Prophylaxis:SCD  TOTAL TIME TAKING CARE OF THIS PATIENT: *30* minutes.  >50% time spent on counselling and coordination of care  POSSIBLE D/C IN *1-2 DAYS, DEPENDING ON CLINICAL CONDITION.  Note: This dictation was prepared with Dragon dictation along with smaller phrase technology. Any transcriptional errors that result from this process are unintentional.  Enedina Finner M.D on 04/24/2019 at 8:35 AM  Between 7am to 6pm - Pager - [phone]  After 6pm go to [email or web address] - Social research officer, government  [facility]  Office  [phone]  CC: Primary care physician; Danella Penton, MDPatient ID: Margaret Stephens, female   DOB: [birth year], 40 y.o.   MRN: [id number]

## 2019-04-24 NOTE — Anesthesia Post-op Follow-up Note (Signed)
Anesthesia QCDR form completed.        

## 2019-04-24 NOTE — Progress Notes (Signed)
   04/24/19 1100  Clinical Encounter Type  Visited With Patient and family together  Visit Type Initial  Referral From Nurse  Consult/Referral To Chaplain  Spiritual Encounters  Spiritual Needs Prayer  Stress Factors  Patient Stress Factors Health changes   This chaplain received a referral from the patient's nurse to visit. Upon arrival, the patient was laying in bed with the lights off. The patient's granddaughter, Maudie Mercury, was at the bedside. This chaplain and the patient engaged in conversation about the patient's longevity and her sunny and positive outlook on life. The patient shared some of her familial history and how that impacts her outlook. The patient shared that she is preparing for surgery this afternoon and is praying that things will go well. This chaplain offered to pray for the patient's surgery and recovery, which the patient gladly accepted. This chaplain will follow up with the patient after her surgery.

## 2019-04-25 ENCOUNTER — Encounter: Payer: Self-pay | Admitting: Orthopedic Surgery

## 2019-04-25 NOTE — Evaluation (Addendum)
Physical Therapy Evaluation Patient Details Name: Margaret Stephens MRN: [id number] DOB: 1925/02/14 Today's Date: 04/25/2019   History of Present Illness  From MD notes: Pt is a 85 y.o. female with a known history of osteoporosis, hyperlipidemia and vertigo who presented to the emergency room with complaints of low back pain radiating to his lower abdomen for the past 2 to 3 days.  Symptoms started after patient had been doing some work on her toilet about 3 days ago which resulted her in leaning forward over her toilet and lifting.  Pt denied fall or trauma.  CT scan of the abdomen and pelvis done revealed new acute mild L1 superior endplate compression fracture with pt now s/p L1 kyphoplasty.    Clinical Impression  Pt presented with deficits in strength, transfers, mobility, gait, balance, and activity tolerance but was motivated to participate throughout.  Per nursing, OK to do trial wean on room air from 2LO2/min.  Pt's SpO2 at rest on 2LO2/min was 94% dropping to 87% with supine therex.  Pt put back on 2LO2/min with SpO2 increasing back to baseline quickly. Pt required min A with bed mobility tasks with log roll technique training provided to pt and family.  Pt was CGA/SBA with transfers with min verbal cues for sequencing to minimize lumbar flexion.  Per Dr. Serita Grit request, pt again taken off O2 for trial with amb on room air with pt's SpO2 dropping to 85% and requiring around 60-90 sec on 2LO2/min for SpO2 to return to >/= 92%.  Pt left on 2LO2/min with nursing updated on pt's response to activity on room air.  Pt will benefit from HHPT services upon discharge to safely address above deficits for decreased caregiver assistance and eventual return to PLOF.      Follow Up Recommendations Home health PT;Supervision for mobility/OOB    Equipment Recommendations  None recommended by PT    Recommendations for Other Services       Precautions / Restrictions Precautions Precautions:  Fall Restrictions Weight Bearing Restrictions: No Other Position/Activity Restrictions: Recent kyphoplasty, no bending over to pick things up from the ground for at least 2 weeks per Dr. Rudene Christians.      Mobility  Bed Mobility Overal bed mobility: Needs Assistance Bed Mobility: Rolling;Sidelying to Sit;Sit to Sidelying Rolling: Min assist Sidelying to sit: Min assist     Sit to sidelying: Min assist General bed mobility comments: Min A with log roll technique with log roll training provided to pt and family  Transfers Overall transfer level: Needs assistance Equipment used: Rolling walker (2 wheeled) Transfers: Sit to/from Stand Sit to Stand: Supervision;Min guard         General transfer comment: Min verbal cues for sequecing for hand placement and to limit lumbar flexion  Ambulation/Gait Ambulation/Gait assistance: Supervision;Min guard Gait Distance (Feet): 30 Feet x 2, 15 feet x 1 Assistive device: Rolling walker (2 wheeled) Gait Pattern/deviations: Step-through pattern;Decreased step length - left;Decreased stance time - right Gait velocity: decreased   General Gait Details: Very slow cadence with short B step length but steady without LOB; SpO2 dropped to 85% with amb on room air but increased back to >/= 92% on 2LO2/min  Stairs            Wheelchair Mobility    Modified Rankin (Stroke Patients Only)       Balance Overall balance assessment: Needs assistance Sitting-balance support: Feet supported Sitting balance-Leahy Scale: Good     Standing balance support: Bilateral upper extremity supported Standing  balance-Leahy Scale: Fair                               Pertinent Vitals/Pain Pain Assessment: 0-10 Pain Score: 2  Pain Location: Surgical incision Pain Descriptors / Indicators: Sore Pain Intervention(s): Premedicated before session;Monitored during session    Home Living Family/patient expects to be discharged to:: Private  residence Living Arrangements: Alone Available Help at Discharge: Family;Available 24 hours/day Type of Home: House Home Access: Stairs to enter Entrance Stairs-Rails: None Entrance Stairs-Number of Steps: 1 very small threshold to enter, 1-2" high Home Layout: One level Home Equipment: Bedside commode;Walker - 2 wheels;Cane - single point Additional Comments: Owns a lift chair    Prior Function Level of Independence: Independent         Comments: Pt Ind with amb indoors and outdoors without an AD, no fall history, Ind with ADLs, takes sponge baths, family drives pt as needed     Hand Dominance        Extremity/Trunk Assessment   Upper Extremity Assessment Upper Extremity Assessment: Generalized weakness    Lower Extremity Assessment Lower Extremity Assessment: Generalized weakness    Cervical / Trunk Assessment Cervical / Trunk Assessment: Kyphotic  Communication   Communication: No difficulties  Cognition Arousal/Alertness: Awake/alert Behavior During Therapy: WFL for tasks assessed/performed Overall Cognitive Status: Within Functional Limits for tasks assessed                                        General Comments      Exercises Total Joint Exercises Ankle Circles/Pumps: AROM;Strengthening;Both;5 reps;10 reps Quad Sets: Strengthening;Both;5 reps;10 reps Gluteal Sets: Strengthening;Both;10 reps Heel Slides: AROM;Both;5 reps Hip ABduction/ADduction: AROM;AAROM;Both;10 reps Long Arc Quad: AROM;Strengthening;Both;10 reps;15 reps Marching in Standing: AROM;Both;10 reps;Standing Other Exercises Other Exercises: Log roll technique training with pt and family Other Exercises: HEP education for BLE APs, QS, LAQs, and GS x 10 each 5-6x/day Other Exercises: Encouraged use of incentive spirometer during the session   Assessment/Plan    PT Assessment Patient needs continued PT services  PT Problem List Decreased strength;Decreased activity  tolerance;Decreased balance;Decreased mobility;Decreased knowledge of use of DME       PT Treatment Interventions DME instruction;Gait training;Stair training;Functional mobility training;Therapeutic activities;Therapeutic exercise;Balance training;Patient/family education    PT Goals (Current goals can be found in the Care Plan section)  Acute Rehab PT Goals Patient Stated Goal: To get back to being on my own PT Goal Formulation: With patient Time For Goal Achievement: 05/08/19 Potential to Achieve Goals: Good    Frequency 7X/week   Barriers to discharge        Co-evaluation               AM-PAC PT "6 Clicks" Mobility  Outcome Measure Help needed turning from your back to your side while in a flat bed without using bedrails?: A Little Help needed moving from lying on your back to sitting on the side of a flat bed without using bedrails?: A Little Help needed moving to and from a bed to a chair (including a wheelchair)?: A Little Help needed standing up from a chair using your arms (e.g., wheelchair or bedside chair)?: A Little Help needed to walk in hospital room?: A Little Help needed climbing 3-5 steps with a railing? : A Little 6 Click Score: 18    End of  Session Equipment Utilized During Treatment: Gait belt;Oxygen Activity Tolerance: Patient tolerated treatment well Patient left: in chair;with chair alarm set;with call bell/phone within reach;with SCD's reapplied;with family/visitor present Nurse Communication: Mobility status;Other (comment)(Pt response to activity on room air) PT Visit Diagnosis: Muscle weakness (generalized) (M62.81);Difficulty in walking, not elsewhere classified (R26.2)    Time: 2841-3244 PT Time Calculation (min) (ACUTE ONLY): 44 min   Charges:   PT Evaluation $PT Eval Moderate Complexity: 1 Mod PT Treatments $Gait Training: 8-22 mins $Therapeutic Exercise: 8-22 mins        D. Scott Arraya Buck PT, DPT 04/25/19, 11:54 AM

## 2019-04-25 NOTE — TOC Progression Note (Signed)
Transition of Care [facility]) - Progression Note    Patient Details  Name: Margaret Stephens MRN: [id number] Date of Birth: Aug 31, 1924  Transition of Care [facility]) CM/SW [place], RN Phone Number: 04/25/2019, 11:59 AM  Clinical Narrative:     Met with the patient and her Grqand daughter Margaret Stephens in the room The patient has DME at home and does not need More She is agreeable to [facility], [facility] was set up by the Ortho office prior to surgery I notified Margaret Stephens at [facility] of the patient being here  Expected Discharge Plan: [place] Barriers to Discharge: Continued Medical Work up  Expected Discharge Plan and Services Expected Discharge Plan: [place]   Discharge Planning Services: CM Consult   Living arrangements for the past 2 months: [place] Determinants of Health (SDOH) Interventions    Readmission Risk Interventions No flowsheet data found.

## 2019-04-25 NOTE — Progress Notes (Signed)
   Subjective: 1 Day Post-Op Procedure(s) (LRB): KYPHOPLASTY L1 (N/A) Patient reports pain as mild, 4 out of 10.  Pain much improved, states 75% improvement.  No numbness tingling radicular symptoms.  Patient states she was able to get up and on the bedside commode with significantly less pain.  Has not had physical therapy yet this morning. Patient is well, and has had no acute complaints or problems Denies any CP, SOB, ABD pain. We will continue therapy today.  Plan is to go Home after hospital stay.  Objective: Vital signs in last 24 hours: Temp:  [97.6 F (36.4 C)-99.5 F (37.5 C)] 98.3 F (36.8 C) (09/22 0742) Pulse Rate:  [84-95] 93 (09/22 0742) Resp:  [12-23] 17 (09/22 0742) BP: (105-144)/(52-88) 124/72 (09/22 0742) SpO2:  [85 %-100 %] 94 % (09/22 0742) Weight:  [59 kg] 59 kg (09/21 1404)  Intake/Output from previous day: 09/21 0701 - 09/22 0700 In: -  Out: 300 [Urine:300] Intake/Output this shift: No intake/output data recorded.  Recent Labs    04/22/19 1140 04/23/19 0407 04/24/19 1748  HGB 13.9 12.1 12.9   Recent Labs    04/23/19 0407 04/24/19 1748  WBC 7.1 10.6*  RBC 4.09 4.37  HCT 38.3 41.2  PLT 162 162   Recent Labs    04/22/19 1140 04/23/19 0407 04/24/19 1748  NA 139 139  --   K 4.2 4.3  --   CL 100 103  --   CO2 29 29  --   BUN 9 10  --   CREATININE 0.60 0.43* 0.51  GLUCOSE 132* 123*  --   CALCIUM 9.5 9.3  --    No results for input(s): LABPT, INR in the last 72 hours.  EXAM General - Patient is Alert, Appropriate and Oriented Lumbar spine -minimal tenderness along the incision site.  Band-Aid is intact.  No swelling warmth or drainage.  Minimally tender along the spinous process with percussion along L1.  Past Medical History:  Diagnosis Date  . Anxiety   . High cholesterol   . Osteoporosis   . Vertigo     Assessment/Plan:   1 Day Post-Op Procedure(s) (LRB): KYPHOPLASTY L1 (N/A) Active Problems:   Lumbar compression fracture  (HCC)  Estimated body mass index is 26.26 kg/m as calculated from the following:   Height as of this encounter: 4\' 11"  (1.499 m).   Weight as of this encounter: 59 kg.  Progress activity as tolerated with physical therapy.  Overall pain seems to be well improved. Patient can remove Band-Aid today and begin showering Follow-up with [facility] orthopedics in 2 weeks for x-rays of the lumbar spine.   Ronney Asters, PA-C [place] 04/25/2019, 9:45 AM

## 2019-04-25 NOTE — Anesthesia Postprocedure Evaluation (Signed)
Anesthesia Post Note  Patient: Margaret Stephens  Procedure(s) Performed: KYPHOPLASTY L1 (N/A Back)  Patient location during evaluation: PACU Anesthesia Type: General Level of consciousness: awake and alert Pain management: pain level controlled Vital Signs Assessment: post-procedure vital signs reviewed and stable Respiratory status: spontaneous breathing, nonlabored ventilation and respiratory function stable Cardiovascular status: blood pressure returned to baseline and stable Postop Assessment: no apparent nausea or vomiting Anesthetic complications: no     Last Vitals:  Vitals:   04/24/19 2013 04/24/19 2303  BP: (!) 106/59 (!) 105/58  Pulse: 93 84  Resp: 16 16  Temp: 36.5 C 36.4 C  SpO2: 94% 94%    Last Pain:  Vitals:   04/24/19 2303  TempSrc: Oral  PainSc:                  [name]

## 2019-04-25 NOTE — Discharge Summary (Signed)
[facility] - [place] at [facility]   PATIENT NAME: Margaret Stephens    MR#:  [id number]  DATE OF BIRTH:  [birth year]  DATE OF ADMISSION:  04/22/2019 ADMITTING PHYSICIAN: Jama Flavors, MD  DATE OF DISCHARGE: 04/25/2019  PRIMARY CARE PHYSICIAN: Danella Penton, MD    ADMISSION DIAGNOSIS:  Generalized abdominal pain [R10.84] Hypoxia [R09.02] Intractable back pain [M54.9] Compression fracture of L1 vertebra, initial encounter (HCC) [S32.010A]  DISCHARGE DIAGNOSIS:  compression fracture of L1 vertebra s/p Kyphoplasty  SECONDARY DIAGNOSIS:   Past Medical History:  Diagnosis Date  . Anxiety   . High cholesterol   . Osteoporosis   . Vertigo     HOSPITAL COURSE:   85 year old female with history of hyperlipidemia and osteoporosis who presented to the emergency room with complaints of back pain following doing some work in hertoilet about 3 days ago.  1.Compression fracture of the L1 superior endplate 25 % height loss -No fall or trauma.  -Symptoms started following patient doing some walk  about 3 days ago.  -still able to ambulate but with significant pains. --Pain control with p.o. Percocet or ultram as needed -appreciate Dr. Elenor Legato input.  -Dr Rosita Kea performed Kyphoplasty on 04/25/2019  -Physical therapy recommends HHPT  2.Hypoxia w/o respiratory distress -Oxygen saturation was 95% on room air on presentation.  -Patient stable. Weaned to room air stats 94%  3.Hyperlipidemia Continue statins.  4. DVT prophylaxis;SCDs for now in anticipation for kyphoplasty  Pt's family has been updated by Dr Rosita Kea  D/c home with HHPT CONSULTS OBTAINED:  Treatment Team:  Donato Heinz, MD Kennedy Bucker, MD  DRUG ALLERGIES:  No Known Allergies  DISCHARGE MEDICATIONS:   Allergies as of 04/25/2019   No Known Allergies     Medication List    TAKE these medications   aspirin EC 81 MG tablet Take 81 mg by mouth every evening.   cholecalciferol 25  MCG (1000 UT) tablet Commonly known as: VITAMIN D3 Take 1,000 Units by mouth daily.   LORazepam 0.5 MG tablet Commonly known as: ATIVAN Take 0.5 mg by mouth 2 (two) times daily.   meclizine 12.5 MG tablet Commonly known as: ANTIVERT Take 1 tablet (12.5 mg total) by mouth 3 (three) times daily. What changed:   when to take this  reasons to take this   oxyCODONE-acetaminophen 5-325 MG tablet Commonly known as: Percocet Take 1 tablet by mouth every 6 (six) hours as needed for up to 3 days for severe pain.   simvastatin 40 MG tablet Commonly known as: ZOCOR Take 40 mg by mouth every evening.            Durable Medical Equipment  (From admission, onward)         Start     Ordered   04/24/19 1656  DME Walker rolling  Once    Question:  Patient needs a walker to treat with the following condition  Answer:  S/P kyphoplasty   04/24/19 1655          If you experience worsening of your admission symptoms, develop shortness of breath, life threatening emergency, suicidal or homicidal thoughts you must seek medical attention immediately by calling 911 or calling your MD immediately  if symptoms less severe.  You Must read complete instructions/literature along with all the possible adverse reactions/side effects for all the Medicines you take and that have been prescribed to you. Take any new Medicines after you have completely understood and accept all the possible adverse  reactions/side effects.   Please note  You were cared for by a hospitalist during your hospital stay. If you have any questions about your discharge medications or the care you received while you were in the hospital after you are discharged, you can call the unit and asked to speak with the hospitalist on call if the hospitalist that took care of you is not available. Once you are discharged, your primary care physician will handle any further medical issues. Please note that NO REFILLS for any discharge  medications will be authorized once you are discharged, as it is imperative that you return to your primary care physician (or establish a relationship with a primary care physician if you do not have one) for your aftercare needs so that they can reassess your need for medications and monitor your lab values. Today   SUBJECTIVE   Doing well. Grand dter in the room  VITAL SIGNS:  Blood pressure 124/72, pulse 93, temperature 98.3 F (36.8 C), resp. rate 17, height 4\' 11"  (1.499 m), weight 59 kg, SpO2 94 %.  I/O:    Intake/Output Summary (Last 24 hours) at 04/25/2019 1217 Last data filed at 04/25/2019 1003 Gross per 24 hour  Intake 240 ml  Output 300 ml  Net -60 ml    PHYSICAL EXAMINATION:  GENERAL:  85 y.o.-year-old patient lying in the bed with no acute distress.  EYES: Pupils equal, round, reactive to light and accommodation. No scleral icterus. Extraocular muscles intact.  HEENT: Head atraumatic, normocephalic. Oropharynx and nasopharynx clear.  NECK:  Supple, no jugular venous distention. No thyroid enlargement, no tenderness.  LUNGS: Normal breath sounds bilaterally, no wheezing, rales,rhonchi or crepitation. No use of accessory muscles of respiration.  CARDIOVASCULAR: S1, S2 normal. No murmurs, rubs, or gallops.  ABDOMEN: Soft, non-tender, non-distended. Bowel sounds present. No organomegaly or mass.  EXTREMITIES: No pedal edema, cyanosis, or clubbing.  NEUROLOGIC: Cranial nerves II through XII are intact. Muscle strength 5/5 in all extremities. Sensation intact. Gait not checked.  PSYCHIATRIC: The patient is alert and oriented x 3.  SKIN: No obvious rash, lesion, or ulcer.   DATA REVIEW:   CBC  Recent Labs  Lab 04/24/19 1748  WBC 10.6*  HGB 12.9  HCT 41.2  PLT 162    Chemistries  Recent Labs  Lab 04/22/19 1140 04/23/19 0407 04/24/19 1748  NA 139 139  --   K 4.2 4.3  --   CL 100 103  --   CO2 29 29  --   GLUCOSE 132* 123*  --   BUN 9 10  --   CREATININE  0.60 0.43* 0.51  CALCIUM 9.5 9.3  --   MG  --  1.9  --   AST 21  --   --   ALT 13  --   --   ALKPHOS 64  --   --   BILITOT 0.9  --   --     Microbiology Results   Recent Results (from the past 240 hour(s))  SARS CORONAVIRUS 2 (TAT 6-24 HRS) Nasopharyngeal Nasopharyngeal Swab     Status: None   Collection Time: 04/22/19  3:23 PM   Specimen: Nasopharyngeal Swab  Result Value Ref Range Status   SARS Coronavirus 2 NEGATIVE NEGATIVE Final    Comment: (NOTE) SARS-CoV-2 target nucleic acids are NOT DETECTED. The SARS-CoV-2 RNA is generally detectable in upper and lower respiratory specimens during the acute phase of infection. Negative results do not preclude SARS-CoV-2 infection, do not rule  out co-infections with other pathogens, and should not be used as the sole basis for treatment or other patient management decisions. Negative results must be combined with clinical observations, patient history, and epidemiological information. The expected result is Negative. Fact Sheet for Patients: HairSlick.no Fact Sheet for Healthcare Providers: quierodirigir.com This test is not yet approved or cleared by the [place] FDA and  has been authorized for detection and/or diagnosis of SARS-CoV-2 by FDA under an Emergency Use Authorization (EUA). This EUA will remain  in effect (meaning this test can be used) for the duration of the COVID-19 declaration under Section 56 4(b)(1) of the Act, 21 U.S.C. section 360bbb-3(b)(1), unless the authorization is terminated or revoked sooner. Performed at [facility], [address] [place], [place] [address]     RADIOLOGY:  Dg Lumbar Spine 2-3 Views  Result Date: 04/24/2019 CLINICAL DATA:  Intraoperative imaging for kyphoplasty. EXAM: LUMBAR SPINE - 2-3 VIEW; DG C-ARM 1-60 MIN COMPARISON:  MRI lumbar spine 04/22/2019. FINDINGS: 6 fluoroscopic intraoperative spot views are provided.  Images demonstrate localization of the L1 level and injection of cement into the vertebral body. No acute finding. IMPRESSION: Intraoperative imaging for L1 kyphoplasty. Electronically Signed   By: Drusilla Kanner M.D.   On: 04/24/2019 16:12   Dg C-arm 1-60 Min  Result Date: 04/24/2019 CLINICAL DATA:  Intraoperative imaging for kyphoplasty. EXAM: LUMBAR SPINE - 2-3 VIEW; DG C-ARM 1-60 MIN COMPARISON:  MRI lumbar spine 04/22/2019. FINDINGS: 6 fluoroscopic intraoperative spot views are provided. Images demonstrate localization of the L1 level and injection of cement into the vertebral body. No acute finding. IMPRESSION: Intraoperative imaging for L1 kyphoplasty. Electronically Signed   By: Drusilla Kanner M.D.   On: 04/24/2019 16:12     CODE STATUS:     Code Status Orders  (From admission, onward)         Start     Ordered   04/22/19 1549  Do not attempt resuscitation (DNR)  Continuous    Question Answer Comment  In the event of cardiac or respiratory ARREST Do not call a "code blue"   In the event of cardiac or respiratory ARREST Do not perform Intubation, CPR, defibrillation or ACLS   In the event of cardiac or respiratory ARREST Use medication by any route, position, wound care, and other measures to relive pain and suffering. May use oxygen, suction and manual treatment of airway obstruction as needed for comfort.   Comments Patient clearly wishes to be DNR/DNI.  Son at bedside agrees with her decision.      04/22/19 1549        Code Status History    Date Active Date Inactive Code Status Order ID Comments User Context   09/15/2017 2043 09/19/2017 1917 Full Code [id number]  Adrian Saran, MD ED   09/15/2017 2031 09/15/2017 2043 DNR [id number]  Adrian Saran, MD ED   05/21/2017 1628 05/23/2017 1344 Full Code [id number]  Katha Hamming, MD ED   Advance Care Planning Activity    Advance Directive Documentation     Most Recent Value  Type of Advance Directive  Healthcare Power of  Attorney [DENNIS Blucher [place] ]  Pre-existing out of facility DNR order (yellow form or pink MOST form)  -  "MOST" Form in Place?  -      TOTAL TIME TAKING CARE OF THIS PATIENT: *40* minutes.    Enedina Finner M.D on 04/25/2019 at 12:17 PM  Between 7am to 6pm - Pager - [phone] After  6pm go to [email or web address] - Social research officer, governmentpassword [email or web address]  [facility]  Office  [phone]  CC: Primary care physician; Danella PentonMiller, Mark F, MD

## 2019-05-21 ENCOUNTER — Encounter: Payer: Self-pay | Admitting: Orthopedic Surgery

## 2020-02-28 DIAGNOSIS — E538 Deficiency of other specified B group vitamins: Secondary | ICD-10-CM | POA: Insufficient documentation

## 2020-03-08 IMAGING — CT CT ABD-PELV W/ CM
2 of 5 series · 16 of 46 positions shown, 18 images · IV contrast (APPLIED)
Comparison: CT abdomen pelvis dated May 21, 2017.

CLINICAL DATA: Abdominal pain.

EXAM:
CT ABDOMEN AND PELVIS WITH CONTRAST
TECHNIQUE: Multidetector CT imaging of the abdomen and pelvis was performed
using the standard protocol following bolus administration of
intravenous contrast.
CONTRAST:  75mL OMNIPAQUE IOHEXOL 300 MG/ML  SOLN

[Series 2: routine abd/pel with · axial · 0.65mm/px · z∈[-498,-148]mm · 13 of 80 slices shown, 15 images]
[im 5/80  soft-tissue]
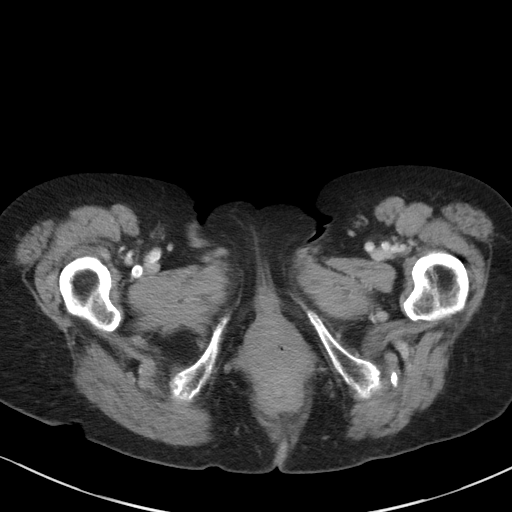
[im 5/80  bone]
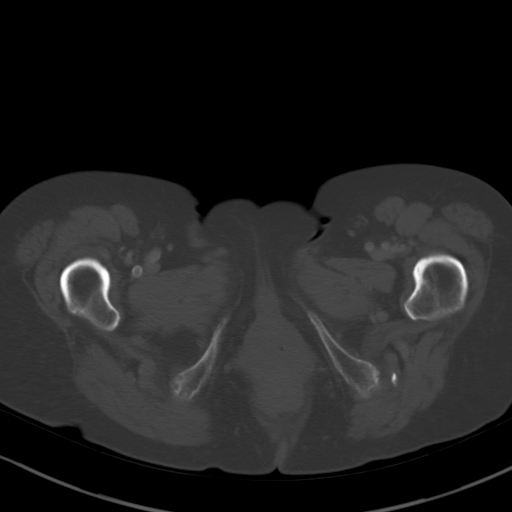
[im 9/80  soft-tissue]
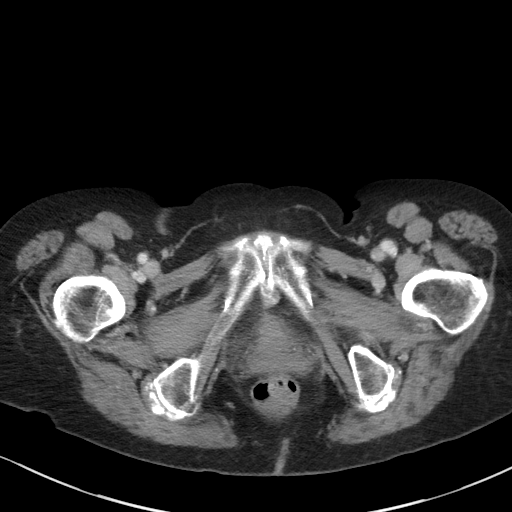
[im 18/80  soft-tissue]
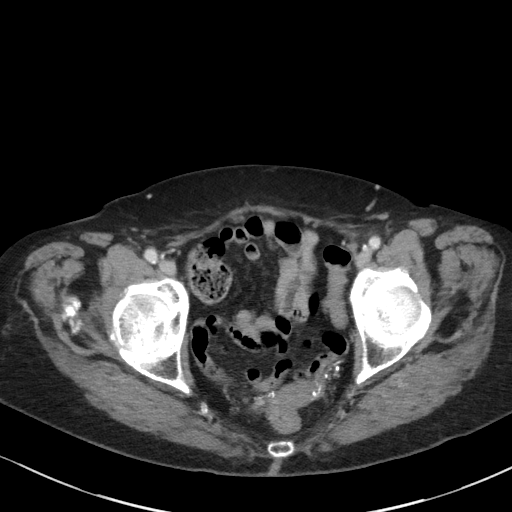
[im 22/80  soft-tissue]
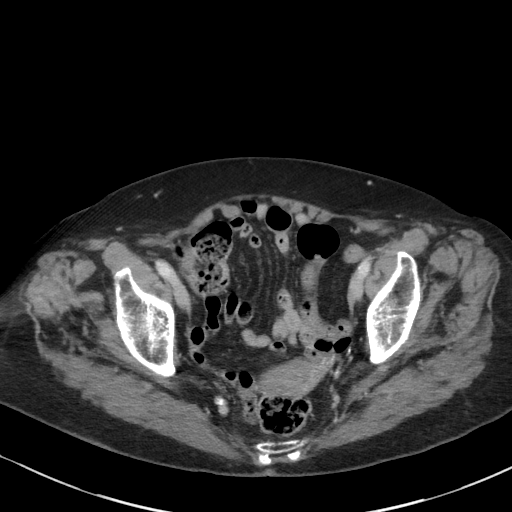
[im 27/80  soft-tissue]
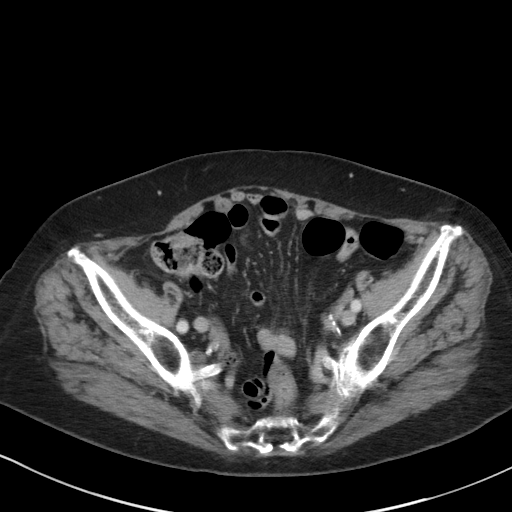
[im 36/80  soft-tissue]
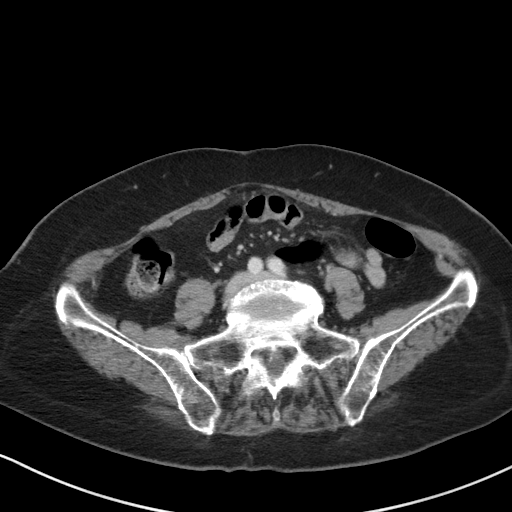
[im 40/80  soft-tissue]
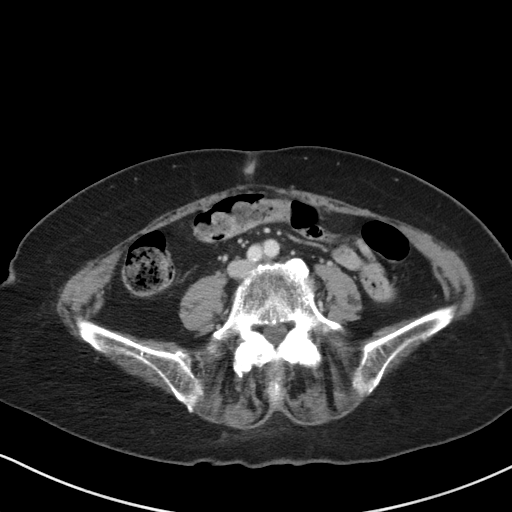
[im 44/80  soft-tissue]
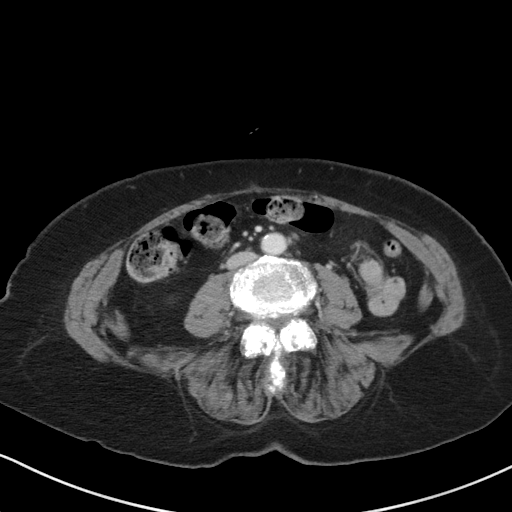
[im 53/80  soft-tissue]
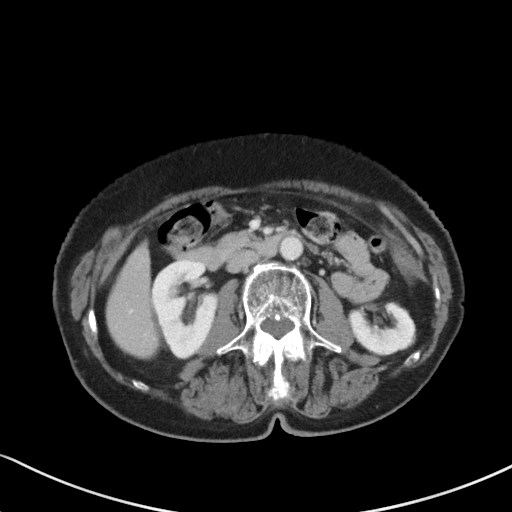
[im 53/80  bone]
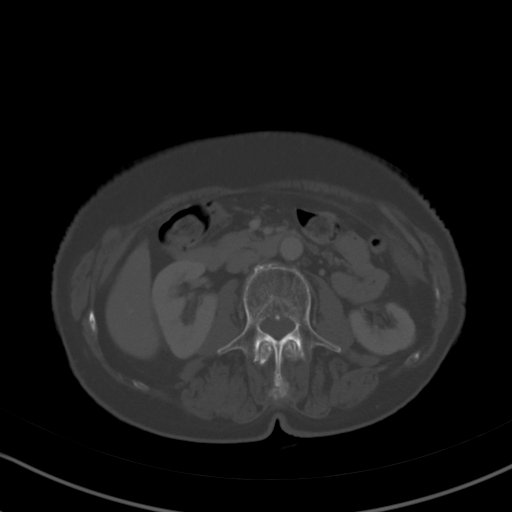
[im 58/80  soft-tissue]
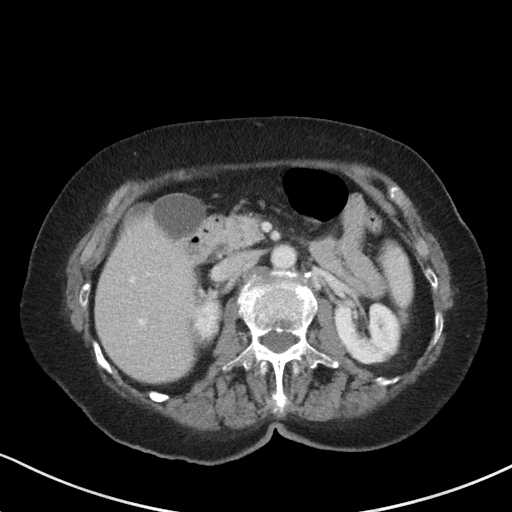
[im 62/80  soft-tissue]
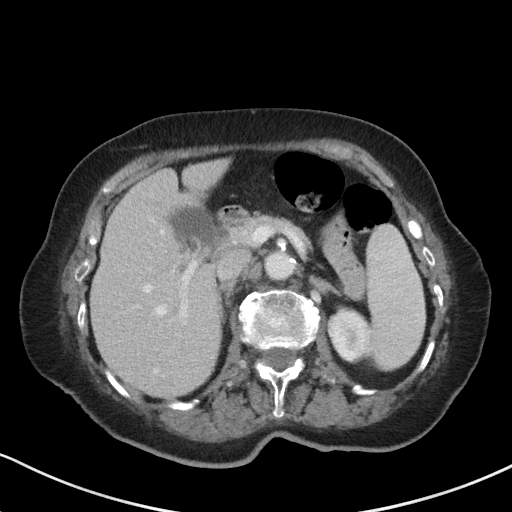
[im 71/80  soft-tissue]
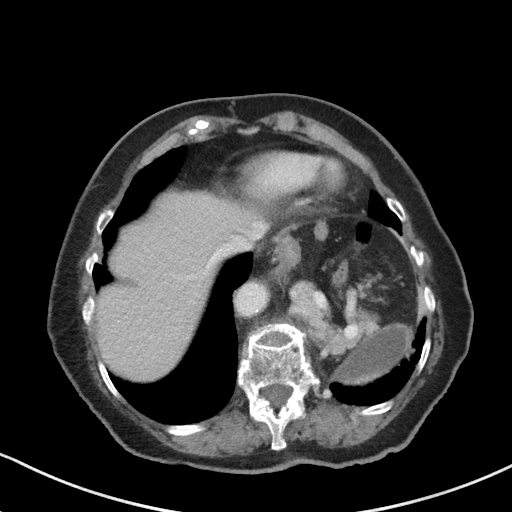
[im 75/80  soft-tissue]
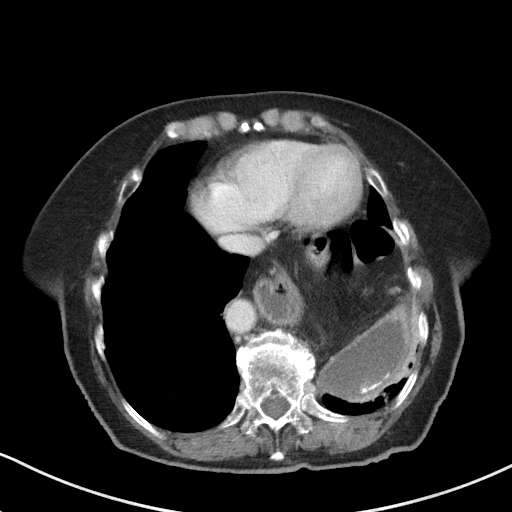

[Series 5: coronal st · coronal · 0.69mm/px · 3 of 85 slices shown]
[im 29/85  soft-tissue]
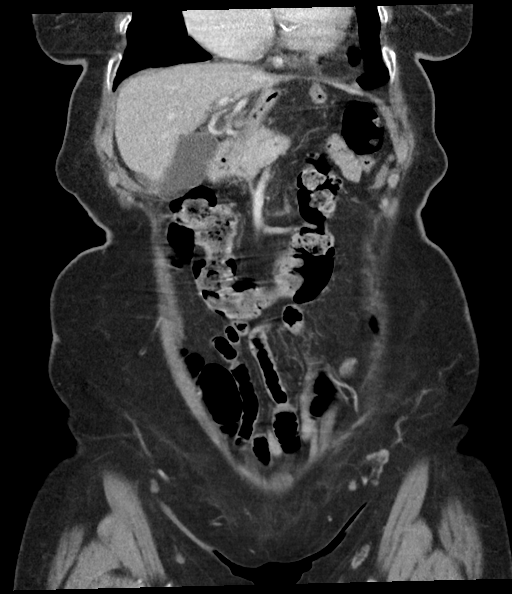
[im 38/85  soft-tissue]
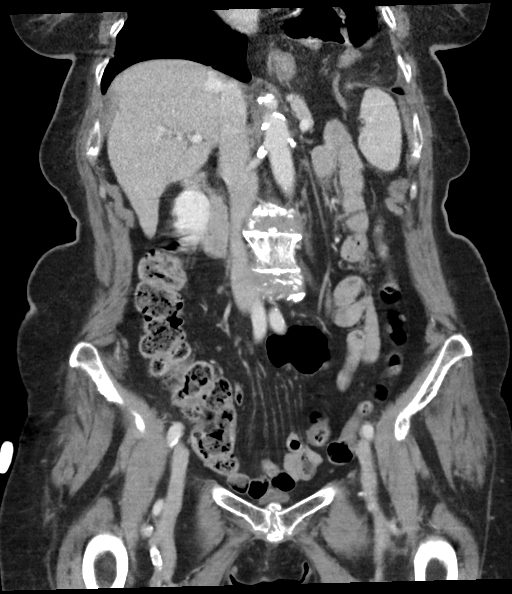
[im 47/85  soft-tissue]
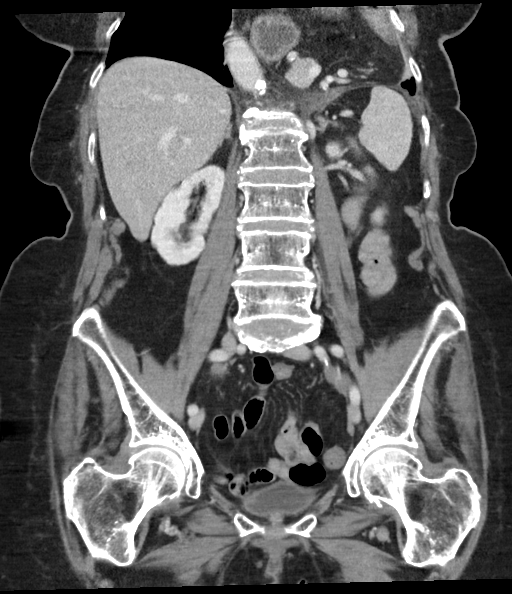

[16 of 46 positions shown; findings below may reference images not displayed]

FINDINGS: Lower chest: No acute abnormality. Unchanged scarring in the left
lower lobe. Unchanged large hiatal hernia.

Hepatobiliary: No focal liver abnormality is seen. No gallstones,
gallbladder wall thickening, or biliary dilatation.

Pancreas: Unremarkable. No pancreatic ductal dilatation or
surrounding inflammatory changes.

Spleen: Normal in size without focal abnormality.

Adrenals/Urinary Tract: The adrenal glands are unremarkable.
Unchanged 5 mm right renal calculus. New punctate left renal
calculus in the lower pole. No hydronephrosis. Mild anterior bladder
wall thickening, favored related to underdistention of this area.

Stomach/Bowel: Hiatal hernia, as above. No bowel wall thickening,
distention, or surrounding inflammatory changes. Normal appendix
(series 5, image 50).

Vascular/Lymphatic: Aortic atherosclerosis. No enlarged abdominal or
pelvic lymph nodes.

Reproductive: Uterus and bilateral adnexa are unremarkable.

Other: No free fluid or pneumoperitoneum.

Musculoskeletal: New acute mild L1 superior endplate compression
fracture. No retropulsion.
IMPRESSION: 1. New acute mild L1 superior endplate compression fracture.
2. Unchanged large hiatal hernia.
3. Bilateral nephrolithiasis.
4.  Aortic atherosclerosis ([id number]-M93.3).

## 2022-10-28 ENCOUNTER — Other Ambulatory Visit: Payer: Self-pay

## 2022-10-28 ENCOUNTER — Observation Stay
Admission: EM | Admit: 2022-10-28 | Discharge: 2022-10-29 | Disposition: A | Discharge status: Home Health | Payer: Medicare Other | Attending: Internal Medicine | Admitting: Internal Medicine

## 2022-10-28 ENCOUNTER — Encounter: Payer: Self-pay | Admitting: *Deleted

## 2022-10-28 ENCOUNTER — Inpatient Hospital Stay: Payer: Medicare Other

## 2022-10-28 ENCOUNTER — Emergency Department: Payer: Medicare Other

## 2022-10-28 DIAGNOSIS — L89621 Pressure ulcer of left heel, stage 1: Secondary | ICD-10-CM | POA: Insufficient documentation

## 2022-10-28 DIAGNOSIS — R471 Dysarthria and anarthria: Secondary | ICD-10-CM | POA: Diagnosis not present

## 2022-10-28 DIAGNOSIS — Z79899 Other long term (current) drug therapy: Secondary | ICD-10-CM | POA: Insufficient documentation

## 2022-10-28 DIAGNOSIS — R2990 Unspecified symptoms and signs involving the nervous system: Secondary | ICD-10-CM | POA: Diagnosis not present

## 2022-10-28 DIAGNOSIS — E039 Hypothyroidism, unspecified: Secondary | ICD-10-CM | POA: Insufficient documentation

## 2022-10-28 DIAGNOSIS — R4781 Slurred speech: Principal | ICD-10-CM | POA: Insufficient documentation

## 2022-10-28 DIAGNOSIS — I6521 Occlusion and stenosis of right carotid artery: Secondary | ICD-10-CM | POA: Insufficient documentation

## 2022-10-28 DIAGNOSIS — E785 Hyperlipidemia, unspecified: Secondary | ICD-10-CM | POA: Insufficient documentation

## 2022-10-28 DIAGNOSIS — L8990 Pressure ulcer of unspecified site, unspecified stage: Secondary | ICD-10-CM | POA: Insufficient documentation

## 2022-10-28 DIAGNOSIS — G459 Transient cerebral ischemic attack, unspecified: Secondary | ICD-10-CM | POA: Diagnosis not present

## 2022-10-28 DIAGNOSIS — L89611 Pressure ulcer of right heel, stage 1: Secondary | ICD-10-CM | POA: Diagnosis not present

## 2022-10-28 DIAGNOSIS — I639 Cerebral infarction, unspecified: Secondary | ICD-10-CM | POA: Diagnosis present

## 2022-10-28 DIAGNOSIS — R42 Dizziness and giddiness: Secondary | ICD-10-CM

## 2022-10-28 DIAGNOSIS — F419 Anxiety disorder, unspecified: Secondary | ICD-10-CM | POA: Insufficient documentation

## 2022-10-28 DIAGNOSIS — Z7982 Long term (current) use of aspirin: Secondary | ICD-10-CM | POA: Diagnosis not present

## 2022-10-28 LAB — CBC
HCT: 42 % (ref 36.0–46.0)
Hemoglobin: 13.5 g/dL (ref 12.0–15.0)
MCH: 30.5 pg (ref 26.0–34.0)
MCHC: 32.1 g/dL (ref 30.0–36.0)
MCV: 95 fL (ref 80.0–100.0)
Platelets: 186 10*3/uL (ref 150–400)
RBC: 4.42 MIL/uL (ref 3.87–5.11)
RDW: 13.6 % (ref 11.5–15.5)
WBC: 7.5 10*3/uL (ref 4.0–10.5)
nRBC: 0 % (ref 0.0–0.2)

## 2022-10-28 LAB — URINE DRUG SCREEN, QUALITATIVE (ARMC ONLY)
Amphetamines, Ur Screen: NOT DETECTED
Barbiturates, Ur Screen: NOT DETECTED
Benzodiazepine, Ur Scrn: NOT DETECTED
Cannabinoid 50 Ng, Ur ~~LOC~~: NOT DETECTED
Cocaine Metabolite,Ur ~~LOC~~: NOT DETECTED
MDMA (Ecstasy)Ur Screen: NOT DETECTED
Methadone Scn, Ur: NOT DETECTED
Opiate, Ur Screen: NOT DETECTED
Phencyclidine (PCP) Ur S: NOT DETECTED
Tricyclic, Ur Screen: NOT DETECTED

## 2022-10-28 LAB — COMPREHENSIVE METABOLIC PANEL
ALT: 19 U/L (ref 0–44)
AST: 30 U/L (ref 15–41)
Albumin: 3.9 g/dL (ref 3.5–5.0)
Alkaline Phosphatase: 55 U/L (ref 38–126)
Anion gap: 10 (ref 5–15)
BUN: 13 mg/dL (ref 8–23)
CO2: 22 mmol/L (ref 22–32)
Calcium: 9.2 mg/dL (ref 8.9–10.3)
Chloride: 104 mmol/L (ref 98–111)
Creatinine, Ser: 0.44 mg/dL (ref 0.44–1.00)
GFR, Estimated: >60 mL/min (ref >60)
Glucose, Bld: 112 mg/dL — ABNORMAL HIGH (ref 70–99)
Potassium: 4 mmol/L (ref 3.5–5.1)
Sodium: 136 mmol/L (ref 135–145)
Total Bilirubin: 0.7 mg/dL (ref 0.3–1.2)
Total Protein: 6.9 g/dL (ref 6.5–8.1)

## 2022-10-28 LAB — URINALYSIS, ROUTINE W REFLEX MICROSCOPIC
Bilirubin Urine: NEGATIVE
Glucose, UA: NEGATIVE mg/dL
Hgb urine dipstick: NEGATIVE
Ketones, ur: NEGATIVE mg/dL
Leukocytes,Ua: NEGATIVE
Nitrite: NEGATIVE
Protein, ur: NEGATIVE mg/dL
Specific Gravity, Urine: 1.008 (ref 1.005–1.030)
pH: 5 (ref 5.0–8.0)

## 2022-10-28 LAB — DIFFERENTIAL
Abs Immature Granulocytes: 0.01 10*3/uL (ref 0.00–0.07)
Basophils Absolute: 0 10*3/uL (ref 0.0–0.1)
Basophils Relative: 0 %
Eosinophils Absolute: 0 10*3/uL (ref 0.0–0.5)
Eosinophils Relative: 1 %
Immature Granulocytes: 0 %
Lymphocytes Relative: 28 %
Lymphs Abs: 2.1 10*3/uL (ref 0.7–4.0)
Monocytes Absolute: 0.4 10*3/uL (ref 0.1–1.0)
Monocytes Relative: 6 %
Neutro Abs: 4.9 10*3/uL (ref 1.7–7.7)
Neutrophils Relative %: 65 %

## 2022-10-28 LAB — ETHANOL: Alcohol, Ethyl (B): <10 mg/dL (ref <10)

## 2022-10-28 LAB — CBG MONITORING, ED: Glucose-Capillary: 138 mg/dL — ABNORMAL HIGH (ref 70–99)

## 2022-10-28 LAB — PROTIME-INR
INR: 1.1 (ref 0.8–1.2)
Prothrombin Time: 14.2 seconds (ref 11.4–15.2)

## 2022-10-28 LAB — APTT: aPTT: 26 seconds (ref 24–36)

## 2022-10-28 MED ORDER — VITAMIN B-12 1000 MCG PO TABS
1000.0000 ug | ORAL_TABLET | Freq: Every day | ORAL | Status: DC
  Administered 2022-10-29: 1000 ug via ORAL
  Filled 2022-10-28: qty 2
  Filled 2022-10-28 (×2): qty 1

## 2022-10-28 MED ORDER — VITAMIN D 25 MCG (1000 UNIT) PO TABS
1000.0000 [IU] | ORAL_TABLET | Freq: Every day | ORAL | Status: DC
  Administered 2022-10-29: 1000 [IU] via ORAL
  Filled 2022-10-28: qty 1

## 2022-10-28 MED ORDER — PRESERVISION/LUTEIN PO CAPS: 1.0000 | ORAL_CAPSULE | Freq: Every day | ORAL | Status: DC

## 2022-10-28 MED ORDER — CLOPIDOGREL BISULFATE 75 MG PO TABS
75.0000 mg | ORAL_TABLET | Freq: Every day | ORAL | Status: DC
  Administered 2022-10-29: 75 mg via ORAL
  Filled 2022-10-28 (×3): qty 1

## 2022-10-28 MED ORDER — STROKE: EARLY STAGES OF RECOVERY BOOK: Freq: Once | Status: AC

## 2022-10-28 MED ORDER — MECLIZINE HCL 25 MG PO TABS
12.5000 mg | ORAL_TABLET | Freq: Two times a day (BID) | ORAL | Status: DC
  Administered 2022-10-29 (×2): 12.5 mg via ORAL
  Filled 2022-10-28: qty 1
  Filled 2022-10-28 (×2): qty 0.5

## 2022-10-28 MED ORDER — LORAZEPAM 0.5 MG PO TABS
0.5000 mg | ORAL_TABLET | Freq: Two times a day (BID) | ORAL | Status: DC
  Administered 2022-10-29 (×2): 0.5 mg via ORAL
  Filled 2022-10-28 (×3): qty 1

## 2022-10-28 MED ORDER — ASPIRIN 81 MG PO TBEC
81.0000 mg | DELAYED_RELEASE_TABLET | Freq: Every evening | ORAL | Status: DC
  Administered 2022-10-29: 81 mg via ORAL
  Filled 2022-10-28 (×2): qty 1

## 2022-10-28 NOTE — ED Triage Notes (Addendum)
Pt to triage via wheelchair.  Pt reports slurred speech since 11 am.  No headache.  No dizziness.  No chest pain or sob.    Fsbs 139 in triage.  EKG done.  Code stroke called, pt to ct scan with rn.  Pt alert.

## 2022-10-28 NOTE — Assessment & Plan Note (Addendum)
Will admit patient for stroke evaluation per neurology recommendations: - Admit for stroke workup - Permissive HTN x48 hrs from sx onset or until stroke ruled out by MRI goal BP <220/110. PRN labetalol or hydralazine if BP above these parameters. Avoid oral antihypertensives. - MRI brain wo contrast - CTA or MRA H&N - TTE  - Check A1c and LDL + add statin per guidelines - ASA 81mg  daily + plavix 75mg  daily x21 days f/b plavix 75mg  daily monotherapy after that, d/w son and grand daughter about risk of bleeding. They verbalized ok for 21 days fo DAPT. Sons states he as well takes Plavix.  - q4 hr neuro checks - STAT head CT for any change in neuro exam - Tele - PT/OT/SLP - Stroke education - Amb referral to neurology upon discharge - Neurology will continue to follow

## 2022-10-28 NOTE — Assessment & Plan Note (Signed)
Free T 4. TSH.

## 2022-10-28 NOTE — ED Notes (Signed)
Tele neuro button activated at 1836.

## 2022-10-28 NOTE — Hospital Course (Signed)
Admission for code stroke.  Slurred speech about 11.  Better but still has remnant.  TIA workup.

## 2022-10-28 NOTE — Progress Notes (Signed)
1837: Tele stroke cart activated at this time. Pt in CT. Son at bedside. States LKW 0930.  [id number]: TSP paged  1848: TSP on camera at this time 1850: Pt returned from [place]: NCCT results provided to TSP  1910: TSP and TSRN off camera at this time. TSP to follow up with EDP for plan of care.

## 2022-10-28 NOTE — H&P (Signed)
History and Physical    Chief Complaint: Slurred speech.   HISTORY OF PRESENT ILLNESS: Margaret Stephens is an 89 y.o. female seen today for slurred speech since 11 am a code stroke called  and stat head ct non contrast done stat negative for any acute processes.  LNW was 9:30 AM and pt is independent at baseline and lives by herself.  Patient was seen by neurologist today patient was not a tPA candidate as she was outside the window.  Pt has  Past Medical History:  Diagnosis Date   Anxiety    High cholesterol    Osteoporosis    Vertigo    Review of Systems  Neurological:  Positive for speech difficulty.  All other systems reviewed and are negative.  No Known Allergies Past Surgical History:  Procedure Laterality Date   KNEE ARTHROSCOPY Left    KYPHOPLASTY N/A 04/24/2019   Procedure: KYPHOPLASTY L1;  Surgeon: Hessie Knows, MD;  Location: [facility] ORS;  Service: Orthopedics;  Laterality: N/A;   RECTAL SURGERY     MEDICATIONS: Current Outpatient Medications  Medication Instructions   aspirin EC 81 mg, Oral, Every evening   cholecalciferol (VITAMIN D3) 1,000 Units, Oral, Daily   cyanocobalamin (VITAMIN B12) 1,000 mcg, Oral, Daily   LORazepam (ATIVAN) 0.5 mg, Oral, 2 times daily   meclizine (ANTIVERT) 12.5 mg, Oral, 3 times daily   Multiple Vitamins-Minerals (PRESERVISION/LUTEIN PO) 1 tablet, Oral, Daily   ED Course: Pt in Ed is alert/awake and hypertensive/ afebrile.  Vitals:   10/28/22 1900 10/28/22 2000 10/28/22 2100 10/28/22 2130  BP: (!) 197/98 (!) 166/89 (!) 168/77 (!) 179/83  Pulse: 87 75 75 77  Resp: 16 (!) 22 19 (!) 21  Temp: 97.9 F (36.6 C)     SpO2: 92% 93% 93% 93%  Weight:      Height:       No intake/output data recorded. SpO2: 93 % Blood work in ed shows: CBC/ CMP WNL. Results for orders placed or performed during the hospital encounter of 10/28/22 (from the past 24 hour(s))  CBG monitoring, ED     Status: Abnormal   Collection Time: 10/28/22  6:29 PM   Result Value Ref Range   Glucose-Capillary 138 (H) 70 - 99 mg/dL  Ethanol     Status: None   Collection Time: 10/28/22  7:11 PM  Result Value Ref Range   Alcohol, Ethyl (B) <10 <10 mg/dL  CBC     Status: None   Collection Time: 10/28/22  7:11 PM  Result Value Ref Range   WBC 7.5 4.0 - 10.5 K/uL   RBC 4.42 3.87 - 5.11 MIL/uL   Hemoglobin 13.5 12.0 - 15.0 g/dL   HCT 42.0 36.0 - 46.0 %   MCV 95.0 80.0 - 100.0 fL   MCH 30.5 26.0 - 34.0 pg   MCHC 32.1 30.0 - 36.0 g/dL   RDW 13.6 11.5 - 15.5 %   Platelets 186 150 - 400 K/uL   nRBC 0.0 0.0 - 0.2 %  Differential     Status: None   Collection Time: 10/28/22  7:11 PM  Result Value Ref Range   Neutrophils Relative % 65 %   Neutro Abs 4.9 1.7 - 7.7 K/uL   Lymphocytes Relative 28 %   Lymphs Abs 2.1 0.7 - 4.0 K/uL   Monocytes Relative 6 %   Monocytes Absolute 0.4 0.1 - 1.0 K/uL   Eosinophils Relative 1 %   Eosinophils Absolute 0.0 0.0 - 0.5 K/uL  Basophils Relative 0 %   Basophils Absolute 0.0 0.0 - 0.1 K/uL   Immature Granulocytes 0 %   Abs Immature Granulocytes 0.01 0.00 - 0.07 K/uL  Urine Drug Screen, Qualitative     Status: None   Collection Time: 10/28/22  7:11 PM  Result Value Ref Range   Tricyclic, Ur Screen NONE DETECTED NONE DETECTED   Amphetamines, Ur Screen NONE DETECTED NONE DETECTED   MDMA (Ecstasy)Ur Screen NONE DETECTED NONE DETECTED   Cocaine Metabolite,Ur [place] NONE DETECTED NONE DETECTED   Opiate, Ur Screen NONE DETECTED NONE DETECTED   Phencyclidine (PCP) Ur S NONE DETECTED NONE DETECTED   Cannabinoid 50 Ng, Ur [place] NONE DETECTED NONE DETECTED   Barbiturates, Ur Screen NONE DETECTED NONE DETECTED   Benzodiazepine, Ur Scrn NONE DETECTED NONE DETECTED   Methadone Scn, Ur NONE DETECTED NONE DETECTED  Urinalysis, Routine w reflex microscopic -Urine, Clean Catch     Status: Abnormal   Collection Time: 10/28/22  7:11 PM  Result Value Ref Range   Color, Urine STRAW (A) YELLOW   APPearance CLEAR (A) CLEAR   Specific  Gravity, Urine 1.008 1.005 - 1.030   pH 5.0 5.0 - 8.0   Glucose, UA NEGATIVE NEGATIVE mg/dL   Hgb urine dipstick NEGATIVE NEGATIVE   Bilirubin Urine NEGATIVE NEGATIVE   Ketones, ur NEGATIVE NEGATIVE mg/dL   Protein, ur NEGATIVE NEGATIVE mg/dL   Nitrite NEGATIVE NEGATIVE   Leukocytes,Ua NEGATIVE NEGATIVE  Comprehensive metabolic panel     Status: Abnormal   Collection Time: 10/28/22  7:55 PM  Result Value Ref Range   Sodium 136 135 - 145 mmol/L   Potassium 4.0 3.5 - 5.1 mmol/L   Chloride 104 98 - 111 mmol/L   CO2 22 22 - 32 mmol/L   Glucose, Bld 112 (H) 70 - 99 mg/dL   BUN 13 8 - 23 mg/dL   Creatinine, Ser 0.44 0.44 - 1.00 mg/dL   Calcium 9.2 8.9 - 10.3 mg/dL   Total Protein 6.9 6.5 - 8.1 g/dL   Albumin 3.9 3.5 - 5.0 g/dL   AST 30 15 - 41 U/L   ALT 19 0 - 44 U/L   Alkaline Phosphatase 55 38 - 126 U/L   Total Bilirubin 0.7 0.3 - 1.2 mg/dL   GFR, Estimated >60 >60 mL/min   Anion gap 10 5 - 15  Protime-INR     Status: None   Collection Time: 10/28/22  7:55 PM  Result Value Ref Range   Prothrombin Time 14.2 11.4 - 15.2 seconds   INR 1.1 0.8 - 1.2  APTT     Status: None   Collection Time: 10/28/22  7:55 PM  Result Value Ref Range   aPTT 26 24 - 36 seconds   Unresulted Labs (From admission, onward)     Start     Ordered   10/29/22 0500  Lipid panel  (Labs)  Tomorrow morning,   URGENT       Comments: Fasting    10/28/22 2139   10/28/22 2140  Hemoglobin A1c  (Labs)  Once,   URGENT       Comments: To assess prior glycemic control    10/28/22 2139           Pt has received : Orders Placed This Encounter  Procedures   [place]    CT Head Code Stroke - for patients within the Code Stroke Window    Standing Status:   Standing  Number of Occurrences:   1    Order Specific Question:   Radiology Contrast Protocol - do NOT remove file path    Answer:   [email or web address]   MR BRAIN WO CONTRAST    Standing  Status:   Standing    Number of Occurrences:   1    Order Specific Question:   What is the patient's sedation requirement?    Answer:   No Sedation    Order Specific Question:   Does the patient have a pacemaker or implanted devices?    Answer:   No   MR ANGIO NECK W WO CONTRAST    Standing Status:   Standing    Number of Occurrences:   1    Order Specific Question:   If indicated for the ordered procedure, I authorize the administration of contrast media per Radiology protocol    Answer:   Yes    Order Specific Question:   What is the patient's sedation requirement?    Answer:   No Sedation    Order Specific Question:   Does the patient have a pacemaker or implanted devices?    Answer:   No   Ethanol    Standing Status:   Standing    Number of Occurrences:   1   CBC    Standing Status:   Standing    Number of Occurrences:   1   Differential    Standing Status:   Standing    Number of Occurrences:   1   Urine Drug Screen, Qualitative    Standing Status:   Standing    Number of Occurrences:   1   Urinalysis, Routine w reflex microscopic -Urine, Clean Catch    Standing Status:   Standing    Number of Occurrences:   1    Order Specific Question:   Specimen Source    Answer:   Urine, Clean Catch [[id number]]   Comprehensive metabolic panel    Standing Status:   Standing    Number of Occurrences:   1   Protime-INR    Standing Status:   Standing    Number of Occurrences:   1   APTT    Standing Status:   Standing    Number of Occurrences:   1   Lipid panel    Fasting    Standing Status:   Standing    Number of Occurrences:   1   Hemoglobin A1c    To assess prior glycemic control    Standing Status:   Standing    Number of Occurrences:   1   Diet NPO time specified    Standing Status:   Standing    Number of Occurrences:   1   Vital signs    Standing Status:   Standing    Number of Occurrences:   1   Cardiac Monitoring    Standing Status:   Standing    Number of Occurrences:    1   NIH stroke score    On arrival and q 2h x 12h, then q 4h    Standing Status:   Standing    Number of Occurrences:   1   Swallow screen    Standing Status:   Standing    Number of Occurrences:   1   Activate teleneurology    Call [facility] and activate tele-neurology    Standing Status:   Standing    Number of Occurrences:   1  Initiate Carrier Fluid Protocol    Standing Status:   Standing    Number of Occurrences:   1   If O2 sat If O2 Sat < 94%, administer O2 at 2 liters/minute via nasal cannula.    If O2 Sat < 94%, administer O2 at 2 liters/minute via nasal cannula.    Standing Status:   Standing    Number of Occurrences:   1   NIHSS score documentation NIHSS score range: 0-42    NIHSS score range: 0-42    Standing Status:   Standing    Number of Occurrences:   1   Vital signs    Standing Status:   Standing    Number of Occurrences:   1   Notify physician (specify)    Standing Status:   Standing    Number of Occurrences:   20    Order Specific Question:   Notify Physician    Answer:   change in neurologic status based on Neurologic Worsening protocol    Order Specific Question:   Notify Physician    Answer:   new onset dysrhythmia    Order Specific Question:   Notify Physician    Answer:   Systolic BP > 99991111 (not controlled by PRN continuous infusion medications) or less than 100    Order Specific Question:   Notify Physician    Answer:   Pox < 88% despite the use of supplemental O2 via nasal cannula    Order Specific Question:   Notify Physician    Answer:   Heart Rate > 120 or less than 50    Order Specific Question:   Notify Physician    Answer:   Respiratory Rate > 30    Order Specific Question:   Notify Physician    Answer:   Blood Glucose > 180mg /dl or less than 60mg /dl    Order Specific Question:   Notify Physician    Answer:   Temperature goal of 37 C (98.6 F). Acceptable range: 36.5 C - 37.5 C (97.7 F - 99.5 F)    Order Specific Question:   Notify Physician     Answer:   Urine output > 300 ml in 1 hour or less than 30 ml in 4 hours (unless anuric)   Swallow screen - If patient does NOT pass this screen, place order for SLP eval and treat (SLP2) - swallowing evaluation (BSE, MBS and/or diet order as indicated)    - If patient does NOT pass this screen, place order for SLP eval and treat (SLP2) - swallowing evaluation (BSE, MBS and/or diet order as indicated)    Standing Status:   Standing    Number of Occurrences:   1   RN to notify Physician for appropriate diet after patient passes swallow screen    after patient passes swallow screen    Standing Status:   Standing    Number of Occurrences:   1   NIH stroke scale    On arrival to unit, then q 4h    Standing Status:   Standing    Number of Occurrences:   1   Intake and output    Avoid use of indwelling catheter    Standing Status:   Standing    Number of Occurrences:   1   Apply Stroke Care Plan: Ischemic Stroke, TIA    Standing Status:   Standing    Number of Occurrences:   1   Discuss with patient and document patient's  goals for stroke risk factor reduction    Standing Status:   Standing    Number of Occurrences:   1   Initiate Oral Care Protocol    Standing Status:   Standing    Number of Occurrences:   1   Initiate Carrier Fluid Protocol    Standing Status:   Standing    Number of Occurrences:   1   Provide stroke education material to patient and family    Standing Status:   Standing    Number of Occurrences:   1   Nurse to provide smoking / tobacco cessation education    Standing Status:   Standing    Number of Occurrences:   1   Clerk to Activate Code Stroke    Call [facility] [phone]    Standing Status:   Standing    Number of Occurrences:   1    Order Specific Question:   Reason for Consult?    Answer:   Code Stroke   Consult to hospitalist    Standing Status:   Standing    Number of Occurrences:   1    Order Specific Question:   Place call to:    Answer:    hospitalist    Order Specific Question:   Reason for Consult    Answer:   Admit    Order Specific Question:   Diagnosis/Clinical Info for Consult:    Answer:   slurred speech   OT eval and treat    Standing Status:   Standing    Number of Occurrences:   1   PT eval and treat    Standing Status:   Standing    Number of Occurrences:   1   Pulse oximetry, continuous    Standing Status:   Standing    Number of Occurrences:   1   Oxygen therapy Mode or (Route): Nasal cannula; Liters Per Minute: 2; Keep 02 saturation: greater than 94 %    Standing Status:   Standing    Number of Occurrences:   1    Order Specific Question:   Mode or (Route)    Answer:   Nasal cannula    Order Specific Question:   Liters Per Minute    Answer:   2    Order Specific Question:   Keep 02 saturation    Answer:   greater than 94 %   SLP eval and treat Reason for evaluation: Cognitive/Language evaluation    Standing Status:   Standing    Number of Occurrences:   1    Order Specific Question:   Reason for evaluation    Answer:   Cognitive/Language evaluation   CBG monitoring, ED    Standing Status:   Standing    Number of Occurrences:   1   EKG 12-Lead    Standing Status:   Standing    Number of Occurrences:   1   ED EKG    Stroke Symptoms    Standing Status:   Standing    Number of Occurrences:   1    Order Specific Question:   Reason for Exam    Answer:   Other (See Comments)   ECHOCARDIOGRAM COMPLETE    Standing Status:   Standing    Number of Occurrences:   1    Order Specific Question:   Perflutren DEFINITY (image enhancing agent) should be administered unless hypersensitivity or allergy exist    Answer:   Administer Perflutren  Order Specific Question:   Reason for exam-Echo    Answer:   Stroke  I63.9   Saline lock IV    Standing Status:   Standing    Number of Occurrences:   1   Fall precautions    Standing Status:   Standing    Number of Occurrences:   1   Aspiration precautions     Standing Status:   Standing    Number of Occurrences:   1    Meds ordered this encounter  Medications   aspirin EC tablet 81 mg   cyanocobalamin (VITAMIN B12) tablet 1,000 mcg    stroke: early stages of recovery book   clopidogrel (PLAVIX) tablet 75 mg   LORazepam (ATIVAN) tablet 0.5 mg   meclizine (ANTIVERT) tablet 12.5 mg   cholecalciferol (VITAMIN D3) 25 MCG (1000 UNIT) tablet 1,000 Units   PreserVision/Lutein CAPS 1 capsule   Admission Imaging : CT HEAD CODE STROKE WO CONTRAST Result Date: 10/28/2022 CLINICAL DATA:  Code stroke.  Slurred speech since 11 a.m. EXAM: CT HEAD WITHOUT CONTRAST TECHNIQUE: Contiguous axial images were obtained from the base of the skull through the vertex without intravenous contrast. RADIATION DOSE REDUCTION: This exam was performed according to the departmental dose-optimization program which includes automated exposure control, adjustment of the mA and/or kV according to patient size and/or use of iterative reconstruction technique. COMPARISON:  05/21/2017 FINDINGS: Brain: No evidence of acute infarction, hemorrhage, mass, mass effect, or midline shift. No hydrocephalus or extra-axial collection. Periventricular white matter changes, likely the sequela of chronic small vessel ischemic disease. Vascular: No hyperdense vessel. Skull: Negative for fracture or focal lesion. Sinuses/Orbits: No acute finding. Status post bilateral lens replacements. Other: The mastoid air cells are well aerated. ASPECTS [facility] Stroke Program Early CT Score) - Ganglionic level infarction (caudate, lentiform nuclei, internal capsule, insula, M1-M3 cortex): 7 - Supraganglionic infarction (M4-M6 cortex): 3 Total score (0-10 with 10 being normal): 10 IMPRESSION: 1. No acute intracranial process. 2. ASPECTS is 10. Code stroke imaging results were communicated on 10/28/2022 at 6:51 pm to provider [facility] via telephone, who verbally acknowledged these results. Electronically Signed   By: Merilyn Baba M.D.   On: 10/28/2022 18:52    Physical Examination: Vitals:   10/28/22 1900 10/28/22 2000 10/28/22 2100 10/28/22 2130  BP: (!) 197/98 (!) 166/89 (!) 168/77 (!) 179/83  Pulse: 87 75 75 77  Temp: 97.9 F (36.6 C)     Resp: 16 (!) 22 19 (!) 21  Height:      Weight:      SpO2: 92% 93% 93% 93%  BMI (Calculated):       Physical Exam Vitals and nursing note reviewed.  Constitutional:      General: She is not in acute distress.    Appearance: Normal appearance. She is not ill-appearing, toxic-appearing or diaphoretic.  HENT:     Head: Normocephalic and atraumatic.     Right Ear: Hearing and external ear normal.     Left Ear: Hearing and external ear normal.     Nose: Nose normal. No nasal deformity.     Mouth/Throat:     Lips: Pink.     Mouth: Mucous membranes are moist.     Tongue: No lesions.     Pharynx: Oropharynx is clear.  Eyes:     Extraocular Movements: Extraocular movements intact.     Pupils: Pupils are equal, round, and reactive to light.  Neck:     Vascular: Carotid bruit present.  Cardiovascular:     Rate and Rhythm: Normal rate and regular rhythm.     Pulses:          Dorsalis pedis pulses are 2+ on the right side and 2+ on the left side.     Heart sounds: Normal heart sounds.  Pulmonary:     Effort: Pulmonary effort is normal.     Breath sounds: Normal breath sounds.  Abdominal:     General: Bowel sounds are normal. There is no distension.     Palpations: Abdomen is soft. There is no mass.     Tenderness: There is no abdominal tenderness. There is no guarding.     Hernia: No hernia is present.  Musculoskeletal:     Cervical back: Deformity present.     Right lower leg: No edema.     Left lower leg: No edema.     Comments: Kyphosis.   Skin:    General: Skin is warm.  Neurological:     General: No focal deficit present.     Mental Status: She is alert and oriented to person, place, and time.     Cranial Nerves: Cranial nerves 2-12 are intact.      Motor: Motor function is intact.     Deep Tendon Reflexes:     Reflex Scores:      Bicep reflexes are 1+ on the right side and 1+ on the left side.      Patellar reflexes are 1+ on the right side and 1+ on the left side. Psychiatric:        Attention and Perception: Attention normal.        Mood and Affect: Mood normal.        Speech: Speech normal.        Behavior: Behavior normal. Behavior is cooperative.        Cognition and Memory: Cognition normal.     Assessment and Plan: * Slurred speech Will admit patient for stroke evaluation per neurology recommendations: - Admit for stroke workup - Permissive HTN x48 hrs from sx onset or until stroke ruled out by MRI goal BP <220/110. PRN labetalol or hydralazine if BP above these parameters. Avoid oral antihypertensives. - MRI brain wo contrast - CTA or MRA H&N - TTE  - Check A1c and LDL + add statin per guidelines - ASA 81mg  daily + plavix 75mg  daily x21 days f/b plavix 75mg  daily monotherapy after that, d/w son and grand daughter about risk of bleeding. They verbalized ok for 21 days fo [employer] states he as well takes Plavix.  - q4 hr neuro checks - STAT head CT for any change in neuro exam - Tele - PT/OT/SLP - Stroke education - Amb referral to neurology upon discharge - Neurology will continue to follow  Acquired hypothyroidism Free T 4. TSH.      DVT prophylaxis:  Heparin Code Status:  Full code Family Communication:  Jya [id number]  Disposition Plan:  To be determined Consults called:  Neurology Dr. Quinn Axe Admission status: Observation Unit/ Expected LOS: Today's   Para Skeans MD [facility]  6 PM- 2 AM. Please contact me via secure Chat 6 PM-2 AM. [phone]( Pager ) To contact the [facility] Attending or Consulting provider [place] or covering provider during after hours [place], for this patient.   Check the care team in [facility] and look for a) attending/consulting [facility] provider listed and b)  the [facility] team listed Log into [email or web address] and use  [place]'s universal password to access. If you do not have the password, please contact the hospital operator. Locate the [facility] provider you are looking for under [facility] and page to a number that you can be directly reached. If you still have difficulty reaching the provider, please page the [facility] (Director on Call) for the Hospitalists listed on amion for assistance. [email or web address] 10/28/2022, 10:55 PM

## 2022-10-28 NOTE — ED Notes (Signed)
Code Stroke called by Quentin Cornwall, MD. Patient taken to [facility] by Amy, RN. Family taken to room 3.

## 2022-10-28 NOTE — Consult Note (Signed)
NEUROLOGY TELECONSULTATION NOTE   Date of service: October 28, 2022 Patient Name: Margaret Stephens MRN:  [id number] DOB:  1925-03-31 Reason for consult: telestroke  Requesting Provider: Dr. Nance Pear Consult Participants: myself, patient, bedside RN, telestroke RN, patient's son Location of the provider: [place], [place] Location of the patient: [facility]  This consult was provided via telemedicine with 2-way video and audio communication. The patient/family was informed that care would be provided in this way and agreed to receive care in this manner.   _ _ _   _ __   _ __ _ _  __ __   _ __   __ _  History of Present Illness   Margaret Stephens is a 89 y.o. female with PMH significant for  has a past medical history of Anxiety, High cholesterol, Osteoporosis, and Vertigo. who presents after an episode of slurred speech, now significantly improved. LKW 0930 when someone spoke to her on the phone (she is highly functional and lives alone). A family member called her this evening and her speech was very slurred and they called EMS. She is nearly back to baseline but son says her voice is not quite as clear as usual. NIHSS = 1 for dysarthria. Head CT no acute process personal review. No other focal neurologic deficits. TNK not administered 2/2 sx too mild to treat and presentation outside the window. CTA not performed bc exam not c/w LVO.   ROS   Per HPI; all other systems reviewed and are negative  Past History   The following was personally reviewed:  Past Medical History:  Diagnosis Date   Anxiety    High cholesterol    Osteoporosis    Vertigo    Past Surgical History:  Procedure Laterality Date   KNEE ARTHROSCOPY Left    KYPHOPLASTY N/A 04/24/2019   Procedure: KYPHOPLASTY L1;  Surgeon: Hessie Knows, MD;  Location: [facility] ORS;  Service: Orthopedics;  Laterality: N/A;   RECTAL SURGERY     History reviewed. No pertinent family history. Social History   Socioeconomic History   Marital  status: Widowed    Spouse name: Not on file   Number of children: Not on file   Years of education: Not on file   Highest education level: Not on file  Occupational History   Not on file  Tobacco Use   Smoking status: Never   Smokeless tobacco: Never  Substance and Sexual Activity   Alcohol use: No   Drug use: No   Sexual activity: Not on file  Other Topics Concern   Not on file  Social History Narrative   Not on file   Social Determinants of Health   Financial Resource Strain: Not on file  Food Insecurity: Not on file  Transportation Needs: Not on file  Physical Activity: Not on file  Stress: Not on file  Social Connections: Not on file   No Known Allergies  Medications   (Not in a hospital admission)    No current facility-administered medications for this encounter.  Current Outpatient Medications:    aspirin EC 81 MG tablet, Take 81 mg by mouth every evening. , Disp: , Rfl:    cholecalciferol (VITAMIN D3) 25 MCG (1000 UT) tablet, Take 1,000 Units by mouth daily., Disp: , Rfl:    LORazepam (ATIVAN) 0.5 MG tablet, Take 0.5 mg by mouth 2 (two) times daily. , Disp: , Rfl:    meclizine (ANTIVERT) 12.5 MG tablet, Take 1 tablet (12.5 mg total) by  mouth 3 (three) times daily. (Patient taking differently: Take 12.5 mg by mouth 3 (three) times daily as needed for dizziness. ), Disp: 40 tablet, Rfl: 0   simvastatin (ZOCOR) 40 MG tablet, Take 40 mg by mouth every evening. , Disp: , Rfl:   Vitals   Vitals:   10/28/22 1828 10/28/22 1836  BP:  (!) 179/96  Pulse:  92  Resp:  18  SpO2:  93%  Weight: 59 kg   Height: 4\' 11"  (1.499 m)      Body mass index is 26.27 kg/m.  Physical Exam   Exam performed over telemedicine with 2-way video and audio communication and with assistance of bedside RN  Physical Exam Gen: A&O x4, NAD Resp: normal WOB CV: extremities appear well-perfused  Neuro: *MS: A&O x4. Follows multi-step commands.  *Speech: minimal dysarthria, no  aphasia, able to name and repeat *CN: PERRL 21mm, EOMI, VFF by confrontation, sensation intact, smile symmetric, hearing intact to voice *Motor:   Normal bulk.  No tremor, rigidity or bradykinesia. No pronator drift. All extremities appear full-strength and symmetric. *Sensory: SILT. Symmetric. No double-simultaneous extinction.  *Coordination:  Finger-to-nose, heel-to-shin, rapid alternating motions were intact. *Reflexes:  UTA 2/2 tele-exam *Gait: deferred  NIHSS = 1 for dysarthria  Premorbid mRS = 0   Labs   CBC: No results for input(s): "WBC", "NEUTROABS", "HGB", "HCT", "MCV", "PLT" in the last 168 hours.  Basic Metabolic Panel:  Lab Results  Component Value Date   NA 139 04/23/2019   K 4.3 04/23/2019   CO2 29 04/23/2019   GLUCOSE 123 (H) 04/23/2019   BUN 10 04/23/2019   CREATININE 0.51 04/24/2019   CALCIUM 9.3 04/23/2019   GFRNONAA >60 04/24/2019   GFRAA >60 04/24/2019   Lipid Panel:  Lab Results  Component Value Date   LDLCALC 75 05/22/2017   HgbA1c:  Lab Results  Component Value Date   HGBA1C 5.7 (H) 05/22/2017   Urine Drug Screen: No results found for: "LABOPIA", "COCAINSCRNUR", "LABBENZ", "AMPHETMU", "THCU", "LABBARB"  Alcohol Level No results found for: "ETH"    Impression   89 yo woman with hx HL and vertigo presents after episode of slurred speech, now nearly resolved, c/f TIA vs small ischemic stroke.  Recommendations   - Admit for stroke workup - Permissive HTN x48 hrs from sx onset or until stroke ruled out by MRI goal BP <220/110. PRN labetalol or hydralazine if BP above these parameters. Avoid oral antihypertensives. - MRI brain wo contrast - CTA or MRA H&N - TTE  - Check A1c and LDL + add statin per guidelines - ASA 81mg  daily + plavix 75mg  daily x21 days f/b plavix 75mg  daily monotherapy after that - q4 hr neuro checks - STAT head CT for any change in neuro exam - Tele - PT/OT/SLP - Stroke education - Amb referral to neurology upon  discharge - Neurology will continue to follow  ______________________________________________________________________   Thank you for the opportunity to take part in the care of this patient. If you have any further questions, please contact the neurology consultation attending.  Signed,  Su Monks, MD [facility] [phone]  If 7pm- 7am, please page neurology on call as listed in [place].  **Any copied and pasted documentation in this note was written by me in another application not billed for and pasted by me into this document.

## 2022-10-28 NOTE — ED Notes (Signed)
Activated code stroke w/[facility].  

## 2022-10-28 NOTE — ED Provider Notes (Signed)
[facility] Provider Note    Event Date/Time   First MD Initiated Contact with Patient 10/28/22 1842     (approximate)   History   Dysphagia   HPI  Margaret Stephens is a 89 y.o. female  who presents to the emergency department today because of concern for slurred speech. Patient noticed the symptoms around 11 am this morning. Family also noticed the slurred speech and while it has gotten better it is still apparent to patient and family. She denies any weakness or numbness in extremities. Denies any headache. Denies any unusual activity.      Physical Exam   Triage Vital Signs: ED Triage Vitals  Enc Vitals Group     BP 10/28/22 1836 (!) 179/96     Pulse Rate 10/28/22 1836 92     Resp 10/28/22 1836 18     Temp 10/28/22 1900 97.9 F (36.6 C)     Temp src --      SpO2 10/28/22 1836 93 %     Weight 10/28/22 1828 130 lb 1.1 oz (59 kg)     Height 10/28/22 1828 4\' 11"  (1.499 m)     Head Circumference --      Peak Flow --      Pain Score 10/28/22 1828 0     Pain Loc --      Pain Edu? --      Excl. in [place]? --     Most recent vital signs: Vitals:   10/28/22 1836 10/28/22 1900  BP: (!) 179/96 (!) 197/98  Pulse: 92 87  Resp: 18 16  Temp:  97.9 F (36.6 C)  SpO2: 93% 92%   General: Awake, alert, oriented. CV:  Good peripheral perfusion. Regular rate and rhythm. Resp:  Normal effort. Lungs clear Abd:  No distention.  Other:  No difficulty speaking, perhaps slightly slurred although hard to appreciate given I don't know her baseline. No focal weakness in extremities.    ED Results / Procedures / Treatments   Labs (all labs ordered are listed, but only abnormal results are displayed) Labs Reviewed  URINALYSIS, ROUTINE W REFLEX MICROSCOPIC - Abnormal; Notable for the following components:      Result Value   Color, Urine STRAW (*)    APPearance CLEAR (*)    All other components within normal limits  COMPREHENSIVE METABOLIC PANEL - Abnormal;  Notable for the following components:   Glucose, Bld 112 (*)    All other components within normal limits  LIPID PANEL - Abnormal; Notable for the following components:   Cholesterol 217 (*)    LDL Cholesterol 153 (*)    All other components within normal limits  CBG MONITORING, ED - Abnormal; Notable for the following components:   Glucose-Capillary 138 (*)    All other components within normal limits  ETHANOL  CBC  DIFFERENTIAL  URINE DRUG SCREEN, QUALITATIVE ([facility] ONLY)  PROTIME-INR  APTT  T4, FREE  TSH  HEMOGLOBIN A1C     EKG  I, Nance Pear, attending physician, personally viewed and interpreted this EKG  EKG Time: 1833 Rate: 92 Rhythm: normal sinus rhythm Axis: normal Intervals: qtc 442 QRS: narrow ST changes: no st elevation Impression: normal ekg   RADIOLOGY I independently interpreted and visualized the ct head. My interpretation: No bleed Radiology interpretation:  IMPRESSION:  1. No acute intracranial process.  2. ASPECTS is 10.   INance Pear, personally discussed these images and results by phone with the on-call  radiologist and used this discussion as part of my medical decision making.     PROCEDURES:  Critical Care performed: No    MEDICATIONS ORDERED IN ED: Medications - No data to display   IMPRESSION / MDM / [place] / ED COURSE  I reviewed the triage vital signs and the nursing notes.                              Differential diagnosis includes, but is not limited to, TIA, CVA, electrolyte abnormality, infection  Patient's presentation is most consistent with acute presentation with potential threat to life or bodily function.   The patient is on the cardiac monitor to evaluate for evidence of arrhythmia and/or significant heart rate changes.  Patient presented to the emergency department today because of concerns for slurred speech that started around 11 AM.  Code stroke was activated.  Patient is not a TNK  candidate at this time.  On my exam patient continues to have some slight slurred speech.  Dr. Quinn Axe with neurology evaluated the patient.  Recommends inpatient mission for TIA/CVA workup.  Discussed plan with patient and family. Discussed with Dr. Posey Pronto with the hospitalist service who will plan on admission.      FINAL CLINICAL IMPRESSION(S) / ED DIAGNOSES   Final diagnoses:  Slurred speech     Note:  This document was prepared using Dragon voice recognition software and may include unintentional dictation errors.     Nance Pear, MD 10/29/22 [phone]

## 2022-10-29 ENCOUNTER — Inpatient Hospital Stay (HOSPITAL_BASED_OUTPATIENT_CLINIC_OR_DEPARTMENT_OTHER)
Admit: 2022-10-29 | Discharge: 2022-10-29 | Disposition: A | Discharge status: Home / Self Care | Payer: Medicare Other | Attending: Internal Medicine | Admitting: Internal Medicine

## 2022-10-29 DIAGNOSIS — L8990 Pressure ulcer of unspecified site, unspecified stage: Secondary | ICD-10-CM | POA: Insufficient documentation

## 2022-10-29 DIAGNOSIS — R4781 Slurred speech: Secondary | ICD-10-CM | POA: Diagnosis not present

## 2022-10-29 DIAGNOSIS — E785 Hyperlipidemia, unspecified: Secondary | ICD-10-CM | POA: Insufficient documentation

## 2022-10-29 DIAGNOSIS — I6389 Other cerebral infarction: Secondary | ICD-10-CM

## 2022-10-29 DIAGNOSIS — F419 Anxiety disorder, unspecified: Secondary | ICD-10-CM | POA: Diagnosis not present

## 2022-10-29 DIAGNOSIS — I639 Cerebral infarction, unspecified: Secondary | ICD-10-CM | POA: Diagnosis present

## 2022-10-29 DIAGNOSIS — R42 Dizziness and giddiness: Secondary | ICD-10-CM

## 2022-10-29 LAB — LIPID PANEL
Cholesterol: 217 mg/dL — ABNORMAL HIGH (ref 0–200)
HDL: 56 mg/dL (ref >40)
LDL Cholesterol: 153 mg/dL — ABNORMAL HIGH (ref 0–99)
Total CHOL/HDL Ratio: 3.9 RATIO
Triglycerides: 42 mg/dL (ref <150)
VLDL: 8 mg/dL (ref 0–40)

## 2022-10-29 LAB — ECHOCARDIOGRAM COMPLETE
AR max vel: 2.47 cm2
AV Area VTI: 2.4 cm2
AV Area mean vel: 2.31 cm2
AV Mean grad: 4 mmHg
AV Peak grad: 7 mmHg
Ao pk vel: 1.32 m/s
Area-P 1/2: 3.15 cm2
Height: 59 in
MV VTI: 2.2 cm2
S' Lateral: 2.3 cm
Weight: 2081.14 oz

## 2022-10-29 LAB — TSH: TSH: 3.526 u[IU]/mL (ref 0.350–4.500)

## 2022-10-29 LAB — T4, FREE: Free T4: 0.9 ng/dL (ref 0.61–1.12)

## 2022-10-29 MED ORDER — GADOBUTROL 1 MMOL/ML IV SOLN
5.0000 mL | Freq: Once | INTRAVENOUS | Status: AC | PRN
  Administered 2022-10-29: 5 mL via INTRAVENOUS

## 2022-10-29 MED ORDER — PROSIGHT PO TABS
1.0000 | ORAL_TABLET | Freq: Every day | ORAL | Status: DC
  Administered 2022-10-29: 1 via ORAL
  Filled 2022-10-29: qty 1

## 2022-10-29 MED ORDER — ASPIRIN EC 81 MG PO TBEC: DELAYED_RELEASE_TABLET | ORAL | 11 refills | Status: AC

## 2022-10-29 MED ORDER — CLOPIDOGREL BISULFATE 75 MG PO TABS: 75.0000 mg | ORAL_TABLET | Freq: Every day | ORAL | 0 refills | Status: AC

## 2022-10-29 MED ORDER — ATORVASTATIN CALCIUM 40 MG PO TABS: 40.0000 mg | ORAL_TABLET | Freq: Every day | ORAL | 0 refills | Status: AC

## 2022-10-29 NOTE — Discharge Summary (Signed)
Physician Discharge Summary   Patient: Margaret Stephens MRN: [id number] DOB: Dec 04, 1924  Admit date:     10/28/2022  Discharge date: 10/29/22  Discharge Physician: Loletha Grayer   PCP: Rusty Aus, MD   Recommendations at discharge:   Follow-up PCP 5 days Outpatient neurology follow-up.  Discharge Diagnoses: Principal Problem:   Stroke [facility]) Active Problems:   Slurred speech   Vertigo   Pressure injury of skin   Hyperlipidemia, unspecified   Anxiety    Hospital Course: 89 year old female with past medical history of anxiety hyperlipidemia osteoporosis and vertigo.  She presents with slurred speech.  CT scan of the head was negative.  MRI of the brain showed a small acute infarct in the left basal ganglia in close proximity to the posterior limb of the left internal capsule.  No hemorrhage or mass effect.  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  Patient was started on aspirin and Plavix.  3/28.  Echocardiogram did not show any blood clot in the heart.  EF was 60 to 65% moderately elevated pulmonary arterial systolic pressure of 99991111 mm per mercury.  Patient was seen by physical therapy recommended home health.  Speech therapy saw the patient and recommended home health speech therapy.  Neurology recommended aspirin for total of 21 days.  Plavix was started and should be continued indefinitely.  Statin recommended but family is a little concerned about this medication.  Assessment and Plan: * Stroke [facility]) Small acute infarct in the left basal ganglia in close proximity to the posterior limb of the left internal capsule.  Patient's only deficit is slurred speech.  Patient passed swallow evaluation.  Physical therapy recommending home health.  Speech therapy also recommended at home.  Neurology recommended aspirin for 21 days total and Plavix indefinitely.  I did start a statin 40 mg of Lipitor but if she tolerates this can increase to 80 mg.  LDL 153.  Slurred  speech Secondary to stroke.  Anxiety Takes Ativan at home  Hyperlipidemia, unspecified With her stroke and LDL of 153, we need to treat with a statin.  If she tolerates 40 mg of Lipitor can increase to 80 mg as outpatient.  Pressure injury of skin Bilateral heel stage I.  Present on admission.  See full description below.  Vertigo On meclizine at home.         Consultants: Neurology Procedures performed: None Disposition: Home health Diet recommendation:  Cardiac diet DISCHARGE MEDICATION: Allergies as of 10/29/2022   No Known Allergies      Medication List     TAKE these medications    aspirin EC 81 MG tablet One tab daily for 20 more days then stop What changed:  how much to take how to take this when to take this additional instructions   atorvastatin 40 MG tablet Commonly known as: Lipitor Take 1 tablet (40 mg total) by mouth daily.   cholecalciferol 25 MCG (1000 UNIT) tablet Commonly known as: VITAMIN D3 Take 1,000 Units by mouth daily.   clopidogrel 75 MG tablet Commonly known as: PLAVIX Take 1 tablet (75 mg total) by mouth daily. Start taking on: October 30, 2022   cyanocobalamin 1000 MCG tablet Commonly known as: VITAMIN B12 Take 1,000 mcg by mouth daily.   LORazepam 0.5 MG tablet Commonly known as: ATIVAN Take 0.5 mg by mouth 2 (two) times daily.   meclizine 12.5 MG tablet Commonly known as: ANTIVERT Take 1 tablet (12.5 mg total) by mouth 3 (  three) times daily. What changed:  when to take this reasons to take this   PRESERVISION/LUTEIN PO Take 1 tablet by mouth daily.        Follow-up Information     Rusty Aus, MD Follow up in 5 day(s).   Specialty: Internal Medicine Contact information: [place] [id number] [phone]                Discharge Exam: Danley Danker Weights   10/28/22 1828  Weight: 59 kg   Physical Exam HENT:     Head: Normocephalic.      Mouth/Throat:     Pharynx: No oropharyngeal exudate.  Eyes:     General: Lids are normal.     Conjunctiva/sclera: Conjunctivae normal.  Cardiovascular:     Rate and Rhythm: Normal rate and regular rhythm.     Heart sounds: Normal heart sounds, S1 normal and S2 normal.  Pulmonary:     Breath sounds: No decreased breath sounds, wheezing, rhonchi or rales.  Abdominal:     Palpations: Abdomen is soft.     Tenderness: There is no abdominal tenderness.  Musculoskeletal:     Right lower leg: No swelling.     Left lower leg: No swelling.  Skin:    General: Skin is warm.     Findings: No rash.  Neurological:     Mental Status: She is alert and oriented to person, place, and time.     Comments: Intermittent slurred speech.      Condition at discharge: stable  The results of significant diagnostics from this hospitalization (including imaging, microbiology, ancillary and laboratory) are listed below for reference.   Imaging Studies: ECHOCARDIOGRAM COMPLETE  Result Date: 10/29/2022    ECHOCARDIOGRAM REPORT   Patient Name:   Margaret Stephens Date of Exam: 10/29/2022 Medical Rec #:  [id number]    Height:       59.0 in Accession #:    [id number]   Weight:       130.1 lb Date of Birth:  [birth year]    BSA:          1.536 m Patient Age:    89 years     BP:           117/67 mmHg Patient Gender: F            HR:           84 bpm. Exam Location:  [facility] Procedure: 2D Echo, Cardiac Doppler and Color Doppler Indications:     Stroke  History:         Patient has prior history of Echocardiogram examinations, most                  recent 05/22/2017. Stroke;                  Signs/Symptoms:Dizziness/Lightheadedness.  Sonographer:     Wenda Low Referring Phys:  [place] Diagnosing Phys: Ida Rogue MD  Sonographer Comments: Technically difficult study due to poor echo windows and suboptimal parasternal window. Image acquisition challenging due to patient body habitus. IMPRESSIONS  1. Left  ventricular ejection fraction, by estimation, is 60 to 65%. The left ventricle has normal function. The left ventricle has no regional wall motion abnormalities. Left ventricular diastolic parameters are consistent with Grade I diastolic dysfunction (impaired relaxation).  2. Right ventricular systolic function is normal. The right ventricular size is normal. There is moderately elevated pulmonary  artery systolic pressure. The estimated right ventricular systolic pressure is 99991111 mmHg.  3. The mitral valve is normal in structure. Mild mitral valve regurgitation. No evidence of mitral stenosis.  4. Tricuspid valve regurgitation is moderate.  5. The aortic valve is normal in structure. Aortic valve regurgitation is mild. No aortic stenosis is present.  6. The inferior vena cava is normal in size with greater than 50% respiratory variability, suggesting right atrial pressure of 3 mmHg. FINDINGS  Left Ventricle: Left ventricular ejection fraction, by estimation, is 60 to 65%. The left ventricle has normal function. The left ventricle has no regional wall motion abnormalities. The left ventricular internal cavity size was normal in size. There is  no left ventricular hypertrophy. Left ventricular diastolic parameters are consistent with Grade I diastolic dysfunction (impaired relaxation). Right Ventricle: The right ventricular size is normal. No increase in right ventricular wall thickness. Right ventricular systolic function is normal. There is moderately elevated pulmonary artery systolic pressure. The tricuspid regurgitant velocity is 3.35 m/s, and with an assumed right atrial pressure of 8 mmHg, the estimated right ventricular systolic pressure is 99991111 mmHg. Left Atrium: Left atrial size was normal in size. Right Atrium: Right atrial size was normal in size. Pericardium: There is no evidence of pericardial effusion. Mitral Valve: The mitral valve is normal in structure. Mild mitral valve regurgitation. No evidence of  mitral valve stenosis. MV peak gradient, 7.5 mmHg. The mean mitral valve gradient is 3.0 mmHg. Tricuspid Valve: The tricuspid valve is normal in structure. Tricuspid valve regurgitation is moderate . No evidence of tricuspid stenosis. Aortic Valve: The aortic valve is normal in structure. Aortic valve regurgitation is mild. No aortic stenosis is present. Aortic valve mean gradient measures 4.0 mmHg. Aortic valve peak gradient measures 7.0 mmHg. Aortic valve area, by VTI measures 2.40 cm. Pulmonic Valve: The pulmonic valve was normal in structure. Pulmonic valve regurgitation is not visualized. No evidence of pulmonic stenosis. Aorta: The aortic root is normal in size and structure. Venous: The inferior vena cava is normal in size with greater than 50% respiratory variability, suggesting right atrial pressure of 3 mmHg. IAS/Shunts: No atrial level shunt detected by color flow Doppler.  LEFT VENTRICLE PLAX 2D LVIDd:         3.20 cm   Diastology LVIDs:         2.30 cm   LV e' medial:    6.09 cm/s LV PW:         1.10 cm   LV E/e' medial:  17.7 LV IVS:        0.90 cm   LV e' lateral:   7.62 cm/s LVOT diam:     1.90 cm   LV E/e' lateral: 14.2 LV SV:         73 LV SV Index:   47 LVOT Area:     2.84 cm  RIGHT VENTRICLE RV Basal diam:  3.05 cm RV Mid diam:    2.80 cm RV S prime:     16.90 cm/s TAPSE (M-mode): 2.5 cm LEFT ATRIUM             Index        RIGHT ATRIUM           Index LA diam:        2.70 cm 1.76 cm/m   RA Area:     14.60 cm LA Vol (A2C):   56.6 ml 36.85 ml/m  RA Volume:   36.20 ml  23.57 ml/m  LA Vol (A4C):   30.1 ml 19.59 ml/m LA Biplane Vol: 41.1 ml 26.76 ml/m  AORTIC VALVE                    PULMONIC VALVE AV Area (Vmax):    2.47 cm     PV Vmax:       0.81 m/s AV Area (Vmean):   2.31 cm     PV Peak grad:  2.6 mmHg AV Area (VTI):     2.40 cm AV Vmax:           132.00 cm/s AV Vmean:          90.200 cm/s AV VTI:            0.303 m AV Peak Grad:      7.0 mmHg AV Mean Grad:      4.0 mmHg LVOT Vmax:          115.00 cm/s LVOT Vmean:        73.600 cm/s LVOT VTI:          0.256 m LVOT/AV VTI ratio: 0.84  AORTA Ao Root diam: 3.30 cm MITRAL VALVE                TRICUSPID VALVE MV Area (PHT): 3.15 cm     TR Peak grad:   44.9 mmHg MV Area VTI:   2.20 cm     TR Vmax:        335.00 cm/s MV Peak grad:  7.5 mmHg MV Mean grad:  3.0 mmHg     SHUNTS MV Vmax:       1.37 m/s     Systemic VTI:  0.26 m MV Vmean:      77.7 cm/s    Systemic Diam: 1.90 cm MV Decel Time: 241 msec MV E velocity: 108.00 cm/s MV A velocity: 128.00 cm/s MV E/A ratio:  0.84 Ida Rogue MD Electronically signed by Ida Rogue MD Signature Date/Time: 10/29/2022/12:41:41 PM    Final    MR BRAIN WO CONTRAST  Result Date: 10/29/2022 CLINICAL DATA:  Stroke EXAM: MRI HEAD WITHOUT CONTRAST MRA NECK WITHOUT AND WITH CONTRAST TECHNIQUE: Multiplanar, multi-echo pulse sequences of the brain and surrounding structures were acquired without intravenous contrast. Angiographic images of the neck were acquired using MRA technique without and with intravenous contrast. Carotid stenosis measurements (when applicable) are obtained utilizing NASCET criteria, using the distal internal carotid diameter as the denominator. CONTRAST:  24mL GADAVIST GADOBUTROL 1 MMOL/ML IV SOLN COMPARISON:  05/21/2017 FINDINGS: MRI HEAD FINDINGS Brain: Small acute infarct of the left basal ganglia. Is in close proximity to the posterior limb of the left internal capsule. No chronic microhemorrhage or siderosis. There is multifocal hyperintense T2-weighted signal within the white matter. Generalized volume loss. Old bilateral cerebellar small vessel infarcts. The midline structures are normal. Vascular: Normal flow voids. Skull and upper cervical spine: Normal marrow signal. Sinuses/Orbits: No acute or significant finding. Other: None. MRA NECK FINDINGS Aortic arch: Normal Right carotid system: 50% stenosis of the proximal ICA Left carotid system: Mild narrowing of the proximal ICA with less  than 50% stenosis. Vertebral arteries: Right dominant.  Normal. Other: None. IMPRESSION: 1. Small acute infarct of the left basal ganglia in close proximity to the posterior limb of the left internal capsule. No hemorrhage or mass effect. 2. 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 Electronically Signed   By: Ulyses Jarred M.D.   On: 10/29/2022 00:55   MR ANGIO NECK  W WO CONTRAST  Result Date: 10/29/2022 CLINICAL DATA:  Stroke EXAM: MRI HEAD WITHOUT CONTRAST MRA NECK WITHOUT AND WITH CONTRAST TECHNIQUE: Multiplanar, multi-echo pulse sequences of the brain and surrounding structures were acquired without intravenous contrast. Angiographic images of the neck were acquired using MRA technique without and with intravenous contrast. Carotid stenosis measurements (when applicable) are obtained utilizing NASCET criteria, using the distal internal carotid diameter as the denominator. CONTRAST:  25mL GADAVIST GADOBUTROL 1 MMOL/ML IV SOLN COMPARISON:  05/21/2017 FINDINGS: MRI HEAD FINDINGS Brain: Small acute infarct of the left basal ganglia. Is in close proximity to the posterior limb of the left internal capsule. No chronic microhemorrhage or siderosis. There is multifocal hyperintense T2-weighted signal within the white matter. Generalized volume loss. Old bilateral cerebellar small vessel infarcts. The midline structures are normal. Vascular: Normal flow voids. Skull and upper cervical spine: Normal marrow signal. Sinuses/Orbits: No acute or significant finding. Other: None. MRA NECK FINDINGS Aortic arch: Normal Right carotid system: 50% stenosis of the proximal ICA Left carotid system: Mild narrowing of the proximal ICA with less than 50% stenosis. Vertebral arteries: Right dominant.  Normal. Other: None. IMPRESSION: 1. Small acute infarct of the left basal ganglia in close proximity to the posterior limb of the left internal capsule. No hemorrhage or mass effect. 2. 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  Electronically Signed   By: Ulyses Jarred M.D.   On: 10/29/2022 00:55   CT HEAD CODE STROKE WO CONTRAST  Result Date: 10/28/2022 CLINICAL DATA:  Code stroke.  Slurred speech since 11 a.m. EXAM: CT HEAD WITHOUT CONTRAST TECHNIQUE: Contiguous axial images were obtained from the base of the skull through the vertex without intravenous contrast. RADIATION DOSE REDUCTION: This exam was performed according to the departmental dose-optimization program which includes automated exposure control, adjustment of the mA and/or kV according to patient size and/or use of iterative reconstruction technique. COMPARISON:  05/21/2017 FINDINGS: Brain: No evidence of acute infarction, hemorrhage, mass, mass effect, or midline shift. No hydrocephalus or extra-axial collection. Periventricular white matter changes, likely the sequela of chronic small vessel ischemic disease. Vascular: No hyperdense vessel. Skull: Negative for fracture or focal lesion. Sinuses/Orbits: No acute finding. Status post bilateral lens replacements. Other: The mastoid air cells are well aerated. ASPECTS [facility] Stroke Program Early CT Score) - Ganglionic level infarction (caudate, lentiform nuclei, internal capsule, insula, M1-M3 cortex): 7 - Supraganglionic infarction (M4-M6 cortex): 3 Total score (0-10 with 10 being normal): 10 IMPRESSION: 1. No acute intracranial process. 2. ASPECTS is 10. Code stroke imaging results were communicated on 10/28/2022 at 6:51 pm to provider [facility] via telephone, who verbally acknowledged these results. Electronically Signed   By: Merilyn Baba M.D.   On: 10/28/2022 18:52    Microbiology: Results for orders placed or performed during the hospital encounter of 04/22/19  SARS CORONAVIRUS 2 (TAT 6-24 HRS) Nasopharyngeal Nasopharyngeal Swab     Status: None   Collection Time: 04/22/19  3:23 PM   Specimen: Nasopharyngeal Swab  Result Value Ref Range Status   SARS Coronavirus 2 NEGATIVE NEGATIVE Final    Comment:  (NOTE) SARS-CoV-2 target nucleic acids are NOT DETECTED. The SARS-CoV-2 RNA is generally detectable in upper and lower respiratory specimens during the acute phase of infection. Negative results do not preclude SARS-CoV-2 infection, do not rule out co-infections with other pathogens, and should not be used as the sole basis for treatment or other patient management decisions. Negative results must be combined with clinical observations, patient history,  and epidemiological information. The expected result is Negative. Fact Sheet for Patients: SugarRoll.be Fact Sheet for Healthcare Providers: https://www.woods-mathews.com/ This test is not yet approved or cleared by the [place] FDA and  has been authorized for detection and/or diagnosis of SARS-CoV-2 by FDA under an Emergency Use Authorization (EUA). This EUA will remain  in effect (meaning this test can be used) for the duration of the COVID-19 declaration under Section 56 4(b)(1) of the Act, 21 U.S.C. section 360bbb-3(b)(1), unless the authorization is terminated or revoked sooner. Performed at [facility], [place] [address] [place], [place] [address]     Labs: CBC: Recent Labs  Lab 10/28/22 1911  WBC 7.5  NEUTROABS 4.9  HGB 13.5  HCT 42.0  MCV 95.0  PLT 99991111   Basic Metabolic Panel: Recent Labs  Lab 10/28/22 1955  NA 136  K 4.0  CL 104  CO2 22  GLUCOSE 112*  BUN 13  CREATININE 0.44  CALCIUM 9.2   Liver Function Tests: Recent Labs  Lab 10/28/22 1955  AST 30  ALT 19  ALKPHOS 55  BILITOT 0.7  PROT 6.9  ALBUMIN 3.9   CBG: Recent Labs  Lab 10/28/22 1829  GLUCAP 138*    Discharge time spent: greater than 30 minutes.  Signed: Loletha Grayer, MD [facility] 10/29/2022

## 2022-10-29 NOTE — Evaluation (Signed)
Physical Therapy Evaluation Patient Details Name: Margaret Stephens MRN: [id number] DOB: 1925/07/28 Today's Date: 10/29/2022  History of Present Illness  Patient is a 89 year old female with an episode of slurred speech. MRI of brain reports small acute infarct of the left basal ganglia  Clinical Impression  Patient is agreeable to PT. She has no complaints other than self reported slurred speech which is not evident during this evaluation. She is ambulatory with a rolling walker at baseline and lives at home alone with family near by. Supportive family at the bedside.  Today, the patient required no physical assistance for bed mobility or transfer from bed. Min guard to supervision provided for hallway ambulation using rolling walker. She has a rolling walker and elevated toilet at home. She is likely at or close to her baseline level of functional mobility. PT will follow while in the hospital to maximize independence.      Recommendations for follow up therapy are one component of a multi-disciplinary discharge planning process, led by the attending physician.  Recommendations may be updated based on patient status, additional functional criteria and insurance authorization.  Follow Up Recommendations       Assistance Recommended at Discharge PRN  Patient can return home with the following  Help with stairs or ramp for entrance;Assist for transportation    Equipment Recommendations None recommended by PT  Recommendations for Other Services       Functional Status Assessment Patient has had a recent decline in their functional status and demonstrates the ability to make significant improvements in function in a reasonable and predictable amount of time.     Precautions / Restrictions Precautions Precautions: Fall Restrictions Weight Bearing Restrictions: No      Mobility  Bed Mobility Overal bed mobility: Needs Assistance Bed Mobility: Supine to Sit, Sit to Supine     Supine to  sit: Supervision, HOB elevated Sit to supine: Min guard, HOB elevated   General bed mobility comments: verbal cues for technique. increased time and effort required    Transfers Overall transfer level: Needs assistance Equipment used: Rolling walker (2 wheels) Transfers: Sit to/from Stand Sit to Stand: Min guard           General transfer comment: verbal cues for hand placement for safety    Ambulation/Gait Ambulation/Gait assistance: Supervision, Min guard Gait Distance (Feet): 90 Feet Assistive device: Rolling walker (2 wheels) Gait Pattern/deviations: Step-through pattern, Narrow base of support Gait velocity: decreased     General Gait Details: mild unsteadiness with turns that is self corrected without physical assistance. Min gaurd to supervision provided for safety. recommend to continue using rolling walker for safety at home  Stairs            Wheelchair Mobility    Modified Rankin (Stroke Patients Only)       Balance Overall balance assessment: Needs assistance Sitting-balance support: Feet supported Sitting balance-Leahy Scale: Good     Standing balance support: During functional activity Standing balance-Leahy Scale: Fair Standing balance comment: with rolling walker for UE support in standing                             Pertinent Vitals/Pain Pain Assessment Pain Assessment: No/denies pain    Home Living Family/patient expects to be discharged to:: Private residence Living Arrangements: Alone Available Help at Discharge: Family;Available 24 hours/day (Family lives next door.) Type of Home: House Home Access: Stairs to enter  Entrance Stairs-Number of Steps: 1   Home Layout: One level Home Equipment: Conservation officer, nature (2 wheels);Cane - single point;BSC/3in1      Prior Function Prior Level of Function : Independent/Modified Independent             Mobility Comments: intermittent use of rolling walker ADLs Comments:  sponge bathes at baseline. dressing and tolieting independently     Hand Dominance   Dominant Hand: Right    Extremity/Trunk Assessment   Upper Extremity Assessment Upper Extremity Assessment: Overall WFL for tasks assessed    Lower Extremity Assessment Lower Extremity Assessment: Overall WFL for tasks assessed    Cervical / Trunk Assessment Cervical / Trunk Assessment: Kyphotic  Communication   Communication: No difficulties  Cognition Arousal/Alertness: Awake/alert Behavior During Therapy: WFL for tasks assessed/performed Overall Cognitive Status: Within Functional Limits for tasks assessed                                          General Comments      Exercises     Assessment/Plan    PT Assessment Patient needs continued PT services  PT Problem List Decreased strength;Decreased range of motion;Decreased activity tolerance;Decreased balance;Decreased mobility;Decreased safety awareness;Decreased knowledge of precautions       PT Treatment Interventions Gait training;DME instruction;Stair training;Therapeutic activities;Functional mobility training;Balance training;Therapeutic exercise;Neuromuscular re-education;Cognitive remediation    PT Goals (Current goals can be found in the Care Plan section)  Acute Rehab PT Goals Patient Stated Goal: to go home PT Goal Formulation: With patient/family Time For Goal Achievement: 11/12/22 Potential to Achieve Goals: Good    Frequency Min 2X/week     Co-evaluation               AM-PAC PT "6 Clicks" Mobility  Outcome Measure Help needed turning from your back to your side while in a flat bed without using bedrails?: None Help needed moving from lying on your back to sitting on the side of a flat bed without using bedrails?: A Little Help needed moving to and from a bed to a chair (including a wheelchair)?: A Little Help needed standing up from a chair using your arms (e.g., wheelchair or bedside  chair)?: A Little Help needed to walk in hospital room?: A Little Help needed climbing 3-5 steps with a railing? : A Little 6 Click Score: 19    End of Session Equipment Utilized During Treatment: Gait belt Activity Tolerance: Patient tolerated treatment well Patient left: in bed;with call bell/phone within reach;with family/visitor present Nurse Communication: Mobility status PT Visit Diagnosis: Unsteadiness on feet (R26.81);Muscle weakness (generalized) (M62.81)    Time: 1031-1100 PT Time Calculation (min) (ACUTE ONLY): 29 min   Charges:   PT Evaluation $PT Eval Low Complexity: 1 Low PT Treatments $Therapeutic Activity: 8-22 mins        Minna Merritts, PT, MPT   Percell Locus 10/29/2022, 12:17 PM

## 2022-10-29 NOTE — Assessment & Plan Note (Signed)
On meclizine at home.

## 2022-10-29 NOTE — Care Management CC44 (Signed)
Condition Code 44 Documentation Completed  Patient Details  Name: Margaret Stephens MRN: [id number] Date of Birth: [birth year]   Condition Code 44 given:  Yes Patient signature on Condition Code 44 notice:  Yes Documentation of 2 MD's agreement:  Yes Code 44 added to claim:  Yes    Gerilyn Pilgrim, LCSW 10/29/2022, 1:22 PM

## 2022-10-29 NOTE — Evaluation (Signed)
Speech Language Pathology Evaluation Patient Details Name: Margaret Stephens MRN: [id number] DOB: Jan 20, 1925 Today's Date: 10/29/2022 Time: [id number] SLP Time Calculation (min) (ACUTE ONLY): 45 min  Problem List:  Patient Active Problem List   Diagnosis Date Noted   Stroke ([place]) 10/29/2022   Pressure injury of skin 10/29/2022   Slurred speech 10/28/2022   B12 deficiency 02/28/2020   Lumbar compression fracture ([place]) 04/22/2019   Hypoxia 09/15/2017   Acquired hypothyroidism 08/23/2017   Dizziness 05/21/2017   Past Medical History:  Past Medical History:  Diagnosis Date   Anxiety    High cholesterol    Osteoporosis    Vertigo    Past Surgical History:  Past Surgical History:  Procedure Laterality Date   KNEE ARTHROSCOPY Left    KYPHOPLASTY N/A 04/24/2019   Procedure: KYPHOPLASTY L1;  Surgeon: Hessie Knows, MD;  Location: [facility] ORS;  Service: Orthopedics;  Laterality: N/A;   RECTAL SURGERY     HPI:  89yo female admitted 10/28/22 with slurred speech. Pt independent, lives alone at baseline. PMH: anxiety, high cholesterol, osteoporosis, vertigo.  MRI = small acute infarct left basal ganglia.   Assessment / Plan / Recommendation Clinical Impression  Pt seen at bedside for assessment of cognitive-communication. Pt's son and granddaughter were present. Pt currently lives alone with family living on either side. Family assists with transportation, shopping, etc, as pt no longer drives. Pt completed 11th grade. She is right handed.    The Mini-Mental State Examination (MMSE) was administered. Pt scored 24/30, raising concern for mild cognitive impairment. Pt was able to answer orientation questions accurately. She recalled 3 unrelated words immediately, and remembered 2/3 after a delay. Pt was able to spell WORLD, but was unable to accurately spell world backwards. Receptive and Expressive Language skills are intact. Speech is intelligible, but with minimal dysarthria. SLP educated pt/family  re: compensatory strategies for improving clarity of speech. CN exam unremarkable. Pt and family indicate she is not at her speech baseline.   Pt was asked participate in clock drawing task. Her drawing was characterized by placement of all numbers on the right side of the circle. She was unable to accurately demonstrate hand placement to indicate 10 minutes after 11. Difficulty with clock drawing raises concern for deficits in executive function, including but not limited to awareness/insight, organization, planning, and self correction.   Pt is being discharged home this afternoon. Increased supervision is recommended to maximize safety. If pt/family continue to desire living alone, home health speech therapy is recommended for education on compensatory strategies for maximizing clarity of speech, and establishing safety strategies due to high level cognitive impairment. Pt is at risk for injury given presentation today. RN and MD informed of results and recommendations.    SLP Assessment  SLP Recommendation/Assessment: All further Speech Language Pathology needs can be addressed in the next venue of care  SLP Visit Diagnosis: Cognitive communication deficit (R41.841)    Recommendations for follow up therapy are one component of a multi-disciplinary discharge planning process, led by the attending physician.  Recommendations may be updated based on patient status, additional functional criteria and insurance authorization.    Follow Up Recommendations  Home health SLP    Assistance Recommended at Discharge  Frequent or constant Supervision/Assistance  Functional Status Assessment Patient has had a recent decline in their functional status and demonstrates the ability to make significant improvements in function in a reasonable and predictable amount of time.     SLP Evaluation Cognition  Overall Cognitive Status:  Within Functional Limits for tasks assessed Orientation Level: Oriented X4        Comprehension  Auditory Comprehension Overall Auditory Comprehension: (P) Appears within functional limits for tasks assessed Other Conversation Comments: rapid decrease in hearing (left ear) Interfering Components: (P) Hearing    Expression Expression Primary Mode of Expression: Verbal Verbal Expression Overall Verbal Expression: Appears within functional limits for tasks assessed Written Expression Dominant Hand: Right   Oral / Motor  Oral Motor/Sensory Function Overall Oral Motor/Sensory Function: Within functional limits Motor Speech Overall Motor Speech: Impaired (minimal impairment of intelligibility.) Intelligibility: Intelligibility reduced Word: 75-100% accurate Phrase: 75-100% accurate Sentence: 75-100% accurate Conversation: 75-100% accurate Motor Planning: Witnin functional limits Effective Techniques: Slow rate;Over-articulate           Mantaj Chamberlin B. Quentin Ore, [facility], [place] Speech Language Pathologist Office: [phone]  Shonna Chock 10/29/2022, 2:01 PM

## 2022-10-29 NOTE — Assessment & Plan Note (Signed)
With her stroke and LDL of 153, we need to treat with a statin.  If she tolerates 40 mg of Lipitor can increase to 80 mg as outpatient.

## 2022-10-29 NOTE — Assessment & Plan Note (Signed)
Bilateral heel stage I.  Present on admission.  See full description below.

## 2022-10-29 NOTE — TOC Transition Note (Signed)
Transition of Care [facility]) - CM/SW Discharge Note   Patient Details  Name: Margaret Stephens MRN: [id number] Date of Birth: 1925/05/29  [facility] CM/SW Contact:  Gerilyn Pilgrim, LCSW Phone Number: 10/29/2022, 1:56 PM   Clinical Narrative:   Pt discharging home with [facility] for PT/OT/Speech. Corene Cornea with [facility] notified and son notified. CSW signing off.           Patient Goals and CMS Choice      Discharge Placement                         Discharge Plan and Services Additional resources added to the After Visit Summary for                                       Social Determinants of Health (SDOH) Interventions SDOH Screenings   Tobacco Use: Low Risk  (10/28/2022)     Readmission Risk Interventions     No data to display

## 2022-10-29 NOTE — Evaluation (Signed)
Occupational Therapy Evaluation Patient Details Name: Margaret Stephens MRN: [id number] DOB: [birth year] Today's Date: 10/29/2022   History of Present Illness Patient is a 89 year old female with an episode of slurred speech. MRI of brain reports small acute infarct of the left basal ganglia   Clinical Impression   Ms. Nance was seen for OT/PT co-evaluation this date. Prior to hospital admission, pt was independent in all aspects of ADL with family assisting for IADL management. Pt lives alone in a 1 level home with multiple family members nearby who check in on her regularly.  Pt endorses using a 2WW (intermittently) for functional mobility and 1 fall in the last six months. Currently pt reporting symptoms have generally resolved aside from some ongoing difficulty with speech. Pt demonstrates baseline independence to perform ADL and mobility tasks and no strength, sensory, coordination, cognitive, or visual deficits appreciated with assessment. No skilled OT needs identified. Will sign off. Please re-consult if additional OT needs arise.       Recommendations for follow up therapy are one component of a multi-disciplinary discharge planning process, led by the attending physician.  Recommendations may be updated based on patient status, additional functional criteria and insurance authorization.   Assistance Recommended at Discharge Intermittent Supervision/Assistance  Patient can return home with the following      Functional Status Assessment  Patient has not had a recent decline in their functional status  Equipment Recommendations  None recommended by OT    Recommendations for Other Services       Precautions / Restrictions Precautions Precautions: Fall Restrictions Weight Bearing Restrictions: No      Mobility Bed Mobility Overal bed mobility: Needs Assistance Bed Mobility: Sit to Supine       Sit to supine: Min guard, HOB elevated        Transfers Overall transfer  level: Needs assistance Equipment used: Rolling walker (2 wheels) Transfers: Sit to/from Stand Sit to Stand: Supervision           General transfer comment: verbal cues for hand placement for safety      Balance Overall balance assessment: Needs assistance Sitting-balance support: Feet supported Sitting balance-Leahy Scale: Good     Standing balance support: During functional activity, No upper extremity supported Standing balance-Leahy Scale: Fair                             ADL either performed or assessed with clinical judgement   ADL Overall ADL's : At baseline                                       General ADL Comments: Pt able to perform toilet transfer, toileting, and standing HH at baseline level. Family at bedside and pt report she is at or near baseline level of functional independence with only remaining concerns being slurred speech. Pending SLP consult.     Vision Baseline Vision/History: 1 Wears glasses Ability to See in Adequate Light: 1 Impaired Patient Visual Report: No change from baseline       Perception     Praxis      Pertinent Vitals/Pain Pain Assessment Pain Assessment: No/denies pain     Hand Dominance Right   Extremity/Trunk Assessment Upper Extremity Assessment Upper Extremity Assessment: Overall WFL for tasks assessed   Lower Extremity Assessment Lower Extremity Assessment: Overall WFL for tasks  assessed   Cervical / Trunk Assessment Cervical / Trunk Assessment: Kyphotic   Communication Communication Communication: No difficulties   Cognition Arousal/Alertness: Awake/alert Behavior During Therapy: WFL for tasks assessed/performed Overall Cognitive Status: Within Functional Limits for tasks assessed                                       General Comments       Exercises Other Exercises Other Exercises: Pt/family educated on role of OT in acute setting, falls prevention  strategies, safe use of AE/DME for ADL management, and DC recs.   Shoulder Instructions      Home Living Family/patient expects to be discharged to:: Private residence Living Arrangements: Alone Available Help at Discharge: Family;Available 24 hours/day (Family lives next door.) Type of Home: House Home Access: Stairs to enter Technical brewer of Steps: 1   Home Layout: One level     Bathroom Shower/Tub: Sponge bathes at [place]: Handicapped height     Home Equipment: Conservation officer, nature (2 wheels);Cane - single point;BSC/3in1          Prior Functioning/Environment Prior Level of Function : Independent/Modified Independent             Mobility Comments: intermittent use of rolling walker ADLs Comments: sponge bathes at baseline. dressing and tolieting independently        OT Problem List: Decreased strength;Decreased coordination;Decreased range of motion;Decreased safety awareness;Impaired balance (sitting and/or standing);Decreased knowledge of use of DME or AE      OT Treatment/Interventions:      OT Goals(Current goals can be found in the care plan section) Acute Rehab OT Goals Patient Stated Goal: To go home OT Goal Formulation: With patient Time For Goal Achievement: 11/12/22  OT Frequency:      Co-evaluation              AM-PAC OT "6 Clicks" Daily Activity     Outcome Measure Help from another person eating meals?: None Help from another person taking care of personal grooming?: None Help from another person toileting, which includes using toliet, bedpan, or urinal?: None Help from another person bathing (including washing, rinsing, drying)?: A Little Help from another person to put on and taking off regular upper body clothing?: None Help from another person to put on and taking off regular lower body clothing?: A Little 6 Click Score: 22   End of Session Equipment Utilized During Treatment: Gait belt;Rolling walker (2  wheels) Nurse Communication: Mobility status  Activity Tolerance: Patient tolerated treatment well Patient left: in bed;with call bell/phone within reach;with bed alarm set;with family/visitor present  OT Visit Diagnosis: Other abnormalities of gait and mobility (R26.89);Other symptoms and signs involving the nervous system (R29.898)                Time: 1046-1101 OT Time Calculation (min): 15 min Charges:  OT General Charges $OT Visit: 1 Visit OT Evaluation $OT Eval Low Complexity: Ahmeek, M.S., OTR/L 10/29/22, 12:02 PM

## 2022-10-29 NOTE — Progress Notes (Signed)
*  PRELIMINARY RESULTS* Echocardiogram 2D Echocardiogram has been performed.  Margaret Stephens 10/29/2022, 9:21 AM

## 2022-10-29 NOTE — Assessment & Plan Note (Signed)
Takes Ativan at home

## 2022-10-29 NOTE — Care Management Obs Status (Signed)
[facility] NOTIFICATION   Patient Details  Name: Margaret Stephens MRN: [id number] Date of Birth: 1925-06-27   Medicare Observation Status Notification Given:  Yes    Shawnda Mauney, LCSW 10/29/2022, 1:22 PM

## 2022-10-29 NOTE — Assessment & Plan Note (Addendum)
Small acute infarct in the left basal ganglia in close proximity to the posterior limb of the left internal capsule.  Patient's only deficit is slurred speech.  Patient passed swallow evaluation.  Physical therapy recommending home health.  Speech therapy also recommended at home.  Neurology recommended aspirin for 21 days total and Plavix indefinitely.  I did start a statin 40 mg of Lipitor but if she tolerates this can increase to 80 mg.  LDL 153.

## 2022-10-30 LAB — HEMOGLOBIN A1C
Hgb A1c MFr Bld: 5.8 % — ABNORMAL HIGH (ref 4.8–5.6)
Mean Plasma Glucose: 120 mg/dL

## 2023-06-22 ENCOUNTER — Other Ambulatory Visit: Payer: Self-pay

## 2023-06-22 ENCOUNTER — Emergency Department: Payer: Medicare Other

## 2023-06-22 ENCOUNTER — Emergency Department
Admission: EM | Admit: 2023-06-22 | Discharge: 2023-06-22 | Disposition: A | Discharge status: Home / Self Care | Payer: Medicare Other | Attending: Emergency Medicine | Admitting: Emergency Medicine

## 2023-06-22 DIAGNOSIS — Z8673 Personal history of transient ischemic attack (TIA), and cerebral infarction without residual deficits: Secondary | ICD-10-CM | POA: Diagnosis not present

## 2023-06-22 DIAGNOSIS — Z7902 Long term (current) use of antithrombotics/antiplatelets: Secondary | ICD-10-CM | POA: Diagnosis not present

## 2023-06-22 DIAGNOSIS — E785 Hyperlipidemia, unspecified: Secondary | ICD-10-CM | POA: Diagnosis not present

## 2023-06-22 DIAGNOSIS — R4182 Altered mental status, unspecified: Secondary | ICD-10-CM | POA: Diagnosis not present

## 2023-06-22 DIAGNOSIS — I6782 Cerebral ischemia: Secondary | ICD-10-CM | POA: Insufficient documentation

## 2023-06-22 DIAGNOSIS — R410 Disorientation, unspecified: Secondary | ICD-10-CM | POA: Diagnosis present

## 2023-06-22 LAB — CBC
HCT: 40.1 % (ref 36.0–46.0)
Hemoglobin: 12.9 g/dL (ref 12.0–15.0)
MCH: 30 pg (ref 26.0–34.0)
MCHC: 32.2 g/dL (ref 30.0–36.0)
MCV: 93.3 fL (ref 80.0–100.0)
Platelets: 163 10*3/uL (ref 150–400)
RBC: 4.3 MIL/uL (ref 3.87–5.11)
RDW: 13.6 % (ref 11.5–15.5)
WBC: 6.7 10*3/uL (ref 4.0–10.5)
nRBC: 0 % (ref 0.0–0.2)

## 2023-06-22 LAB — URINALYSIS, COMPLETE (UACMP) WITH MICROSCOPIC
Bacteria, UA: NONE SEEN
Bilirubin Urine: NEGATIVE
Glucose, UA: NEGATIVE mg/dL
Hgb urine dipstick: NEGATIVE
Ketones, ur: NEGATIVE mg/dL
Leukocytes,Ua: NEGATIVE
Nitrite: NEGATIVE
Protein, ur: NEGATIVE mg/dL
Specific Gravity, Urine: 1.011 (ref 1.005–1.030)
Squamous Epithelial / HPF: 0 /[HPF] (ref 0–5)
pH: 5 (ref 5.0–8.0)

## 2023-06-22 LAB — COMPREHENSIVE METABOLIC PANEL
ALT: 21 U/L (ref 0–44)
AST: 30 U/L (ref 15–41)
Albumin: 4.1 g/dL (ref 3.5–5.0)
Alkaline Phosphatase: 54 U/L (ref 38–126)
Anion gap: 8 (ref 5–15)
BUN: 15 mg/dL (ref 8–23)
CO2: 25 mmol/L (ref 22–32)
Calcium: 9.2 mg/dL (ref 8.9–10.3)
Chloride: 105 mmol/L (ref 98–111)
Creatinine, Ser: 0.58 mg/dL (ref 0.44–1.00)
GFR, Estimated: >60 mL/min (ref >60)
Glucose, Bld: 110 mg/dL — ABNORMAL HIGH (ref 70–99)
Potassium: 4.2 mmol/L (ref 3.5–5.1)
Sodium: 138 mmol/L (ref 135–145)
Total Bilirubin: 0.8 mg/dL (ref <1.2)
Total Protein: 7.4 g/dL (ref 6.5–8.1)

## 2023-06-22 LAB — PROTIME-INR
INR: 1.1 (ref 0.8–1.2)
Prothrombin Time: 14.6 s (ref 11.4–15.2)

## 2023-06-22 LAB — DIFFERENTIAL
Abs Immature Granulocytes: 0.02 10*3/uL (ref 0.00–0.07)
Basophils Absolute: 0 10*3/uL (ref 0.0–0.1)
Basophils Relative: 0 %
Eosinophils Absolute: 0.1 10*3/uL (ref 0.0–0.5)
Eosinophils Relative: 1 %
Immature Granulocytes: 0 %
Lymphocytes Relative: 24 %
Lymphs Abs: 1.6 10*3/uL (ref 0.7–4.0)
Monocytes Absolute: 0.5 10*3/uL (ref 0.1–1.0)
Monocytes Relative: 7 %
Neutro Abs: 4.6 10*3/uL (ref 1.7–7.7)
Neutrophils Relative %: 68 %

## 2023-06-22 LAB — ETHANOL: Alcohol, Ethyl (B): <10 mg/dL (ref <10)

## 2023-06-22 LAB — APTT: aPTT: 31 s (ref 24–36)

## 2023-06-22 MED ORDER — SODIUM CHLORIDE 0.9% FLUSH: 3.0000 mL | Freq: Once | INTRAVENOUS | Status: DC

## 2023-06-22 NOTE — Discharge Instructions (Addendum)
Please call her primary care provider tomorrow to arrange a follow-up appointment soon as possible.  Return to the emergency department for any further episodes of confusion any weakness or numbness of any arm or leg or any other symptom concerning to yourself.

## 2023-06-22 NOTE — ED Notes (Signed)
Pt taken to room 19 via w/c by [facility] EDT; Report called to [facility] RN

## 2023-06-22 NOTE — ED Triage Notes (Signed)
Patient to ED via POV with family for altered mental status. Family reports patient was confused during the day today while on the phone. Pt reports believing she slept in the recliner- unsure as to why. Hx of TIA- similar situation per family. Patient back at baseline per family. Denies weakness, numbness or slurred speech. Ambulatory with walker.

## 2023-06-22 NOTE — ED Provider Notes (Signed)
[facility] Provider Note    Event Date/Time   First MD Initiated Contact with Patient 06/22/23 2105     (approximate)  History   Chief Complaint: Altered Mental Status  HPI  Margaret Stephens is a 89 y.o. female with a past medical history anxiety, hyperlipidemia, presents to the emergency department for difficulty with her memory.  According to the son around 2 PM today he saw the patient and she was not making sense per son.  She had stated that she was not sure if she slept in her bed or the recliner, was unsure if she took her medicine or if she ate this morning.  Son states normally she would know all of these answers that she is very sharp and lives alone although they check on her each day.  Patient is back to her baseline per family.  Patient is awake alert she answers questions properly follows commands well.  Family states the patient has had a TIA in the past.  Physical Exam   Triage Vital Signs: ED Triage Vitals  Encounter Vitals Group     BP 06/22/23 1622 (!) 153/102     Systolic BP Percentile --      Diastolic BP Percentile --      Pulse Rate 06/22/23 1622 86     Resp 06/22/23 1622 18     Temp 06/22/23 1622 98.6 F (37 C)     Temp Source 06/22/23 1622 Oral     SpO2 06/22/23 1622 98 %     Weight 06/22/23 1623 112 lb (50.8 kg)     Height 06/22/23 1623 4\' 11"  (1.499 m)     Head Circumference --      Peak Flow --      Pain Score 06/22/23 1623 0     Pain Loc --      Pain Education --      Exclude from Growth Chart --     Most recent vital signs: Vitals:   06/22/23 1622  BP: (!) 153/102  Pulse: 86  Resp: 18  Temp: 98.6 F (37 C)  SpO2: 98%    General: Awake, no distress.  CV:  Good peripheral perfusion.  Regular rate and rhythm  Resp:  Normal effort.  Equal breath sounds bilaterally.  Abd:  No distention.  Soft, nontender.  No rebound or guarding. Other:  Equal grip strength bilaterally.  5/5 motor in all extremities.  No cranial  nerve deficits.  No pronator drift.   ED Results / Procedures / Treatments   EKG  EKG viewed and interpreted by myself shows a sinus rhythm 85 bpm the narrow QRS, normal axis, normal intervals, no concerning ST changes.  RADIOLOGY  I have reviewed and interpreted CT head images.  No bleed or significant abnormality seen on my evaluation. Radiology is read the CT scan of the head is negative for acute abnormality.  MEDICATIONS ORDERED IN ED: Medications  sodium chloride flush (NS) 0.9 % injection 3 mL (3 mLs Intravenous Not Given 06/22/23 2124)     IMPRESSION / MDM / ASSESSMENT AND PLAN / ED COURSE  I reviewed the triage vital signs and the nursing notes.  Patient's presentation is most consistent with acute presentation with potential threat to life or bodily function.  Patient presents to the emergency department for an episode of confusion this afternoon.  Symptoms have resolved on the patient is awake alert oriented follows commands well.  Son confirms she is acting her  baseline.  Patient's lab work today is reassuring with a normal CBC, reassuring chemistry.  Patient's CT scan of the head shows no concerning findings.  Overall patient appears well.  Reassuring physical exam.  Normal neurological exam.  Patient is already on Plavix per son after her last mini stroke.  I discussed with the family given her reassuring workup and she is back to her baseline already on Plavix and family would be reluctant to put her on a full anticoagulant I believe the patient would be safe for discharge home and the family states they would stay with her for the next night or 2.  Patient is agreeable to this plan as well.  I would like to check a urinalysis to ensure no urinary tract infection.  If the urine is negative anticipate discharge home with PCP follow-up.  Family agreeable to this plan as well.  Possible TIA.  Patient's urinalysis is normal.  Given the patient's reassuring workup I believe the  patient is safe for discharge home with outpatient follow-up.  Family agreeable to plan of care.  FINAL CLINICAL IMPRESSION(S) / ED DIAGNOSES   Confusion  Note:  This document was prepared using Dragon voice recognition software and may include unintentional dictation errors.   Minna Antis, MD 06/22/23 2230

## 2024-08-07 NOTE — Progress Notes (Signed)
 Chief Complaint  Patient presents with   Margaret Stephens backwards onto a heater (not on)     Patient is agreeable to [employer] scribe.   History of Present Illness Margaret Stephens is a 89 year old female with osteoporosis who presents after a fall at home.  She experienced a fall at home on Saturday night around 10 PM. She got up from her chair to go to bed and fell backwards, landing on the floor. She did not hit her head and was fully conscious throughout the event. After the fall, she dragged herself through the kitchen to her bedroom and has been unable to stand up fully by herself since the fall.  She has soreness in her right leg, particularly between her buttock and the back of her knee. Her right leg is very sore, and she is unable to put pressure on it, leading her to favor her left leg, which has become sore from overuse.  No headaches or pain when breathing. She has a history of osteoporosis and had a couple of light strokes a year and a half to two years ago. She is upset because she cannot perform her usual exercises and walking routine, which she typically does twice a day for thirty minutes inside the house.  Her current medications include Plavix  and Tylenol , which she takes two 500 mg tablets every six to eight hours. She has been using heat therapy to manage her pain.    ROS  Review of systems is unremarkable for any active cardiac, respiratory, GI, GU, hematologic, neurologic, dermatologic, HEENT, or psychiatric symptoms except as noted above.  No fevers, chills, or constitutional symptoms.   Current Outpatient Medications  Medication Sig Dispense Refill   acetaminophen  (TYLENOL ) 500 mg capsule Take by mouth     atorvastatin  (LIPITOR) 10 MG tablet Take 1 tablet (10 mg total) by mouth once daily 90 tablet 3   cholecalciferol  (VITAMIN D3) 1000 unit tablet Take 1,000 Units by mouth once daily     clopidogreL  (PLAVIX ) 75 mg tablet Take 1 tablet (75 mg total) by mouth once  daily 90 tablet 3   cyanocobalamin  (VITAMIN B12) 1000 MCG tablet Take 1,000 mcg by mouth once daily     LORazepam  (ATIVAN ) 0.5 MG tablet Take 1 tablet (0.5 mg total) by mouth 2 (two) times daily as needed for Anxiety for up to 180 days 60 tablet 5   meclizine  (ANTIVERT ) 12.5 mg tablet TAKE 1 TABLET BY MOUTH 3 (THREE) TIMES DAILY AS NEEDED FOR DIZZINESS FOR UP TO 100 DAYS 50 tablet 2   vit C/E/Zn/coppr/lutein /zeaxan (PRESERVISION AREDS-2 ORAL) Take by mouth One daily     lidocaine  (LIDODERM ) 5 % patch Place 1 patch onto the skin daily for 30 days Apply patch to the most painful area for up to 12 hours in a 24 hour period. 30 patch 0   No current facility-administered medications for this visit.    Allergies as of 08/07/2024   (No Known Allergies)    Patient Active Problem List  Diagnosis   Hypercholesteremia   Low serum vitamin D    Adult idiopathic generalized osteoporosis   Medicare annual wellness visit, initial   Acquired hypothyroidism   Aortic atherosclerosis   Closed compression fracture of body of L1 vertebra (CMS/HHS-HCC)   B12 deficiency   Acute CVA (cerebrovascular accident) (CMS/HHS-HCC)    Past Medical History:  Diagnosis Date   Acute CVA (cerebrovascular accident) (CMS/HHS-HCC) 11/06/2022   Left basal ganglia, normal  echo, 50% proximal right RCA, 4/24   DVT (deep venous thrombosis) (CMS/HHS-HCC) 08/03/1997   History of   H/O bone density study    07/08/05; 08/05/11   Hyperlipidemia    Osteoarthrosis    Osteoporosis    Vitamin D  deficiency     Past Surgical History:  Procedure Laterality Date   ARTHROPLASTY TOTAL KNEE Right 03/17/11   KYPHOPLASTY     sept 2020   OTHER SURGERY     vertebre surgery 04/2019   rectal villous     resection    Vitals:   08/07/24 1415  BP: 128/82  Pulse: 91  SpO2: 97%  Weight: 45.5 kg (100 lb 4.8 oz)  PainSc:   2  PainLoc: Leg   Body mass index is 20.25 kg/m.  Exam BP 128/82 (BP Location: Left  upper arm, Patient Position: Sitting, BP Cuff Size: Adult)   Pulse 91   Wt 45.5 kg (100 lb 4.8 oz)   LMP  (LMP Unknown)   SpO2 97%   BMI 20.25 kg/m   General. Well appearing; NAD; VS reviewed     Eyes. Sclera and conjunctiva clear; Vision grossly intact; extraocular movements intact Oropharynx. No suspicious lesions Neck. Supple. No swelling, masses, thyroid  normal size, no masses palpated.   Lungs. Respirations unlabored; clear to auscultation bilaterally Cardiovascular. Heart regular rate and rhythm without murmurs, gallops, or rubs MSK: Patient has no cervical, thoracic, or lumbar spinal tenderness.  Head appears to be atraumatic.  Patient has significant bruising over the proximal portion of the lateral aspect of the right humerus.  Patient is able to raise the arm and move it.  Strength appears to be grossly intact.  Tender to over the bruising when palpating.  When shaking the pelvis there is no pain in the hips or pelvis.  When resisted knee flexion it does cause pain throughout the posterior aspect of the thigh.  No significant bruising of the knee or thigh.  Strength is grossly intact through all hip and knee motions. Skin. Normal color and turgor Neurologic. Alert and oriented x3; CN 2-12 grossly intact; no focal deficits  Assessment & Plan  Fall with right thigh and arm pain Soreness in right thigh and bruise on shoulder and arm. No head injury or major fracture suspected. Differential includes muscle strain or minor fracture. - Ordered femur x-ray to rule out fracture from hip to knee. - Recommended Tylenol  500 mg, two tablets every eight hours for pain management. - Prescribed lidocaine  patches for localized pain relief. - Advised against using heating pads on the affected area. - Discussed the option of a Life Alert device for safety in case of future falls.  Osteoporosis Increased fracture risk with falls. No compression fractures or significant spinal issues noted. -  Continue to monitor for signs of fractures, especially in the context of falls.   Will await official radiology read but I did review x-rays of the humerus and femur and do not see any obvious fracture.  Do believe she may benefit from home health physical therapy.  Referral placed today.  F/U: Patient to follow-up as needed.  JASON HESTLE WHITAKER, PA  This note has been created using automated tools and reviewed for accuracy by JASON HESTLE WHITAKER.   Note: This dictation was prepared with Dragon dictation along with smaller phrase technology. Any transcriptional errors that result from this process are unintentional.

## 2024-08-09 ENCOUNTER — Ambulatory Visit
Admission: RE | Admit: 2024-08-09 | Discharge: 2024-08-09 | Disposition: A | Discharge status: Home / Self Care | Source: Ambulatory Visit | Attending: Orthopedic Surgery | Admitting: Orthopedic Surgery

## 2024-08-09 ENCOUNTER — Other Ambulatory Visit: Payer: Self-pay | Admitting: Orthopedic Surgery

## 2024-08-09 DIAGNOSIS — S32511A Fracture of superior rim of right pubis, initial encounter for closed fracture: Secondary | ICD-10-CM | POA: Diagnosis not present

## 2024-08-09 DIAGNOSIS — S32592A Other specified fracture of left pubis, initial encounter for closed fracture: Secondary | ICD-10-CM | POA: Insufficient documentation

## 2024-08-09 DIAGNOSIS — R102 Pelvic and perineal pain unspecified side: Secondary | ICD-10-CM

## 2024-08-09 DIAGNOSIS — S32119A Unspecified Zone I fracture of sacrum, initial encounter for closed fracture: Secondary | ICD-10-CM | POA: Insufficient documentation

## 2024-08-09 DIAGNOSIS — W19XXXA Unspecified fall, initial encounter: Secondary | ICD-10-CM

## 2024-08-09 DIAGNOSIS — M1611 Unilateral primary osteoarthritis, right hip: Secondary | ICD-10-CM | POA: Insufficient documentation

## 2024-08-09 DIAGNOSIS — S32591A Other specified fracture of right pubis, initial encounter for closed fracture: Secondary | ICD-10-CM

## 2024-08-09 DIAGNOSIS — S72001A Fracture of unspecified part of neck of right femur, initial encounter for closed fracture: Secondary | ICD-10-CM | POA: Insufficient documentation
